# Patient Record
Sex: Female | Born: 1975 | Hispanic: No | Marital: Single | Smoking: Never smoker
Health system: Southern US, Community
[De-identification: ages and names within clinical notes are randomized; demographics above are authoritative.]

## PROBLEM LIST (undated history)

## (undated) DIAGNOSIS — F419 Anxiety disorder, unspecified: Secondary | ICD-10-CM

## (undated) DIAGNOSIS — F32A Depression, unspecified: Secondary | ICD-10-CM

## (undated) DIAGNOSIS — D649 Anemia, unspecified: Secondary | ICD-10-CM

## (undated) DIAGNOSIS — J189 Pneumonia, unspecified organism: Secondary | ICD-10-CM

## (undated) DIAGNOSIS — H8109 Meniere's disease, unspecified ear: Secondary | ICD-10-CM

## (undated) DIAGNOSIS — K219 Gastro-esophageal reflux disease without esophagitis: Secondary | ICD-10-CM

## (undated) DIAGNOSIS — F329 Major depressive disorder, single episode, unspecified: Secondary | ICD-10-CM

## (undated) DIAGNOSIS — M1990 Unspecified osteoarthritis, unspecified site: Secondary | ICD-10-CM

## (undated) HISTORY — PX: NO PAST SURGERIES: SHX2092

---

## 2002-10-28 ENCOUNTER — Other Ambulatory Visit: Admission: RE | Admit: 2002-10-28 | Discharge: 2002-10-28 | Payer: Self-pay | Admitting: Obstetrics and Gynecology

## 2003-12-15 ENCOUNTER — Other Ambulatory Visit: Admission: RE | Admit: 2003-12-15 | Discharge: 2003-12-15 | Payer: Self-pay | Admitting: Obstetrics and Gynecology

## 2004-10-11 ENCOUNTER — Other Ambulatory Visit: Admission: RE | Admit: 2004-10-11 | Discharge: 2004-10-11 | Payer: Self-pay | Admitting: Obstetrics and Gynecology

## 2005-05-10 ENCOUNTER — Encounter: Admission: RE | Admit: 2005-05-10 | Discharge: 2005-05-10 | Payer: Self-pay | Admitting: Emergency Medicine

## 2005-10-27 ENCOUNTER — Other Ambulatory Visit: Admission: RE | Admit: 2005-10-27 | Discharge: 2005-10-27 | Payer: Self-pay | Admitting: Obstetrics and Gynecology

## 2006-03-14 ENCOUNTER — Other Ambulatory Visit: Admission: RE | Admit: 2006-03-14 | Discharge: 2006-03-14 | Payer: Self-pay | Admitting: Obstetrics and Gynecology

## 2006-07-13 ENCOUNTER — Other Ambulatory Visit: Admission: RE | Admit: 2006-07-13 | Discharge: 2006-07-13 | Payer: Self-pay | Admitting: Obstetrics and Gynecology

## 2006-11-21 ENCOUNTER — Other Ambulatory Visit: Admission: RE | Admit: 2006-11-21 | Discharge: 2006-11-21 | Payer: Self-pay | Admitting: Obstetrics and Gynecology

## 2012-03-14 ENCOUNTER — Ambulatory Visit (INDEPENDENT_AMBULATORY_CARE_PROVIDER_SITE_OTHER): Payer: 59 | Admitting: Obstetrics and Gynecology

## 2012-03-14 DIAGNOSIS — Z01419 Encounter for gynecological examination (general) (routine) without abnormal findings: Secondary | ICD-10-CM

## 2012-03-14 DIAGNOSIS — Z202 Contact with and (suspected) exposure to infections with a predominantly sexual mode of transmission: Secondary | ICD-10-CM

## 2014-01-17 ENCOUNTER — Other Ambulatory Visit: Payer: Self-pay | Admitting: Family Medicine

## 2014-01-17 DIAGNOSIS — H9190 Unspecified hearing loss, unspecified ear: Secondary | ICD-10-CM

## 2014-01-27 ENCOUNTER — Ambulatory Visit
Admission: RE | Admit: 2014-01-27 | Discharge: 2014-01-27 | Disposition: A | Discharge status: Home / Self Care | Payer: 59 | Source: Ambulatory Visit | Attending: Family Medicine | Admitting: Family Medicine

## 2014-01-27 DIAGNOSIS — H9190 Unspecified hearing loss, unspecified ear: Secondary | ICD-10-CM

## 2014-01-27 MED ORDER — GADOBENATE DIMEGLUMINE 529 MG/ML IV SOLN
17.0000 mL | Freq: Once | INTRAVENOUS | Status: AC | PRN
  Administered 2014-01-27: 17 mL via INTRAVENOUS

## 2014-01-28 ENCOUNTER — Emergency Department (HOSPITAL_COMMUNITY)
Admission: EM | Admit: 2014-01-28 | Discharge: 2014-01-28 | Disposition: A | Discharge status: Home / Self Care | Payer: 59 | Attending: Emergency Medicine | Admitting: Emergency Medicine

## 2014-01-28 ENCOUNTER — Encounter (HOSPITAL_COMMUNITY): Payer: Self-pay | Admitting: Emergency Medicine

## 2014-01-28 DIAGNOSIS — Z79899 Other long term (current) drug therapy: Secondary | ICD-10-CM | POA: Insufficient documentation

## 2014-01-28 DIAGNOSIS — IMO0002 Reserved for concepts with insufficient information to code with codable children: Secondary | ICD-10-CM | POA: Insufficient documentation

## 2014-01-28 DIAGNOSIS — K115 Sialolithiasis: Secondary | ICD-10-CM | POA: Insufficient documentation

## 2014-01-28 MED ORDER — HYDROCODONE-ACETAMINOPHEN 5-325 MG PO TABS
1.0000 | ORAL_TABLET | Freq: Once | ORAL | Status: AC
  Administered 2014-01-28: 1 via ORAL
  Filled 2014-01-28: qty 1

## 2014-01-28 MED ORDER — AMOXICILLIN 500 MG PO CAPS
1000.0000 mg | ORAL_CAPSULE | Freq: Once | ORAL | Status: AC
  Administered 2014-01-28: 1000 mg via ORAL
  Filled 2014-01-28: qty 2

## 2014-01-28 MED ORDER — HYDROCODONE-ACETAMINOPHEN 5-325 MG PO TABS: 1.0000 | ORAL_TABLET | Freq: Four times a day (QID) | ORAL | Status: DC | PRN

## 2014-01-28 NOTE — ED Provider Notes (Signed)
CSN: [id number]     Arrival date & time 01/28/14  0219 History   First MD Initiated Contact with Patient 01/28/14 0301     Chief Complaint  Patient presents with  . Oral Swelling   (Consider location/radiation/quality/duration/timing/severity/associated sxs/prior Treatment) HPI This is a 38 year old female with a four-day history of pain in her right submandibular salivary gland. She has noticed a knot in her right Wharton's duct which she suspects is a stone. She has been attempting to massage the stone out and has been sucking on sour candy without relief. She was seen by her PCP yesterday and placed on naproxen and amoxicillin. She states the naproxen has not helped the pain at all and she has not yet started the amoxicillin. The pain is intermittent and is moderate to severe at its worst. It radiates to the right side of the face. She denies fever. She has been sucking on hard candies and massaging the duct without relief.  History reviewed. No pertinent past medical history. History reviewed. No pertinent past surgical history. History reviewed. No pertinent family history. History  Substance Use Topics  . Smoking status: Never Smoker   . Smokeless tobacco: Not on file  . Alcohol Use: Yes   OB History   Grav Para Term Preterm Abortions TAB SAB Ect Mult Living                 Review of Systems  All other systems reviewed and are negative.    Allergies  Review of patient's allergies indicates no known allergies.  Home Medications   Current Outpatient Rx  Name  Route  Sig  Dispense  Refill  . buPROPion (WELLBUTRIN XL) 150 MG 24 hr tablet   Oral   Take 150 mg by mouth daily.         . cetirizine (ZYRTEC ALLERGY) 10 MG tablet   Oral   Take 10 mg by mouth daily.         . drospirenone-ethinyl estradiol (YASMIN 28) 3-0.03 MG tablet   Oral   Take 1 tablet by mouth daily.         . fluticasone (FLONASE) 50 MCG/ACT nasal spray   Each Nare   Place 2 sprays into both  nostrils daily.         Marland Kitchen. ibuprofen (ADVIL,MOTRIN) 200 MG tablet   Oral   Take 400 mg by mouth every 6 (six) hours as needed (pain).         . naproxen (NAPROSYN) 500 MG tablet   Oral   Take 500 mg by mouth 2 (two) times daily as needed (pain).          BP 128/89  Pulse 79  Temp(Src) 98.1 F (36.7 C) (Oral)  Resp 19  SpO2 97%  LMP 01/27/2014  Physical Exam General: Well-developed, well-nourished female in no acute distress; appearance consistent with age of record HENT: normocephalic; atraumatic; swelling and tenderness of right submandibular salivary gland without erythema or warmth; stone visible and palpable in Wharton's duct on the right Eyes: pupils equal, round and reactive to light; extraocular muscles intact Neck: supple; no lymphadenopathy Heart: regular rate and rhythm Lungs: Normal respiratory effort and excursion Abdomen: soft; nondistended Extremities: No deformity; full range of motion Neurologic: Awake, alert and oriented; motor function intact in all extremities and symmetric; no facial droop Skin: Warm and dry Psychiatric: Normal mood and affect    ED Course  Procedures (including critical care time)    MDM  The patient  is established with Dr. Jenne Pane at [facility]. We will have her contact him in the morning for definitive treatment.    Hanley Seamen, MD 01/28/14 [phone]

## 2014-01-28 NOTE — Discharge Instructions (Signed)
Salivary Stone  Your exam shows you have a stone in one of your saliva glands. These small stones form around a mucous plug in the ducts of the glands and cause the saliva in the gland to be blocked. This makes the gland swollen and painful, especially when you eat. If repeated episodes occur, the gland can become infected. Sometimes these stones can be seen on x-ray.  Treatment includes stimulating the production of saliva to push the stone out. You should suck on a lemon or sour candies several times daily. Antibiotic medicine may be needed if the gland is infected. Increasing fluids, applying warm compresses to the swollen area 3-4 times daily, and massaging the gland from back to front may encourage drainage and passage of the stone.  Surgical treatment to remove the stone is sometimes necessary, so proper medical follow up is very important. Call your doctor for an appointment as recommended. Call right away if you have a high fever, severe headache, vomiting, uncontrolled pain, or other serious symptoms.  Document Released: 01/19/2005 Document Revised: 03/05/2012 Document Reviewed: 12/12/2005  Exit[employer] Patient Information 2014 [employer].

## 2014-01-28 NOTE — ED Notes (Signed)
Pt states that her salivary glands are blocked and she has pain to her R lower jaw/neck area. Pt has swelling to the R side of face. Pt seen at dr yesterday and was given naproxen and amoxicillin for treatment. Pt reports that pain has gotten worse. Pt denies any trouble swallowing or breathing. Pt does report pain going up to her R ear.

## 2014-01-28 NOTE — ED Notes (Signed)
Patient is alert and oriented x3.  She was given DC instructions and follow up visit instructions.  Patient gave verbal understanding. She was DC ambulatory under her own power to home.  V/S stable.  He was not showing any signs of distress on DC 

## 2015-02-12 ENCOUNTER — Ambulatory Visit (INDEPENDENT_AMBULATORY_CARE_PROVIDER_SITE_OTHER): Payer: BLUE CROSS/BLUE SHIELD | Admitting: Sports Medicine

## 2015-02-12 ENCOUNTER — Encounter: Payer: Self-pay | Admitting: Sports Medicine

## 2015-02-12 ENCOUNTER — Ambulatory Visit
Admission: RE | Admit: 2015-02-12 | Discharge: 2015-02-12 | Disposition: A | Discharge status: Home / Self Care | Payer: BLUE CROSS/BLUE SHIELD | Source: Ambulatory Visit | Attending: Sports Medicine | Admitting: Sports Medicine

## 2015-02-12 VITALS — BP 120/82 | Ht 64.0 in | Wt 186.0 lb

## 2015-02-12 DIAGNOSIS — M545 Low back pain, unspecified: Secondary | ICD-10-CM

## 2015-02-12 NOTE — Progress Notes (Signed)
  Karen Mullins - 39 y.o. female MRN [id number]  Date of birth: [birth year]  SUBJECTIVE: CC: Low Back pain, new patient HPI:   Worsening low back pain since starting exercise regimen with personal trainer. Dionne Milo(Victoria Glovitch).  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 Pain is worse with hyperextension.   generalized low back and reports "switching sides ". Occasionally radiates to mid thoracic region.    prior issues with right-sided " pinched nerves" located over the  Right flank region.   Denies radicular components but does occasionally have tightness in her thighs and calf swelling and exercising.     Worse with prolonged walking, has not noted improvement with using a shopping cart    pain is worse with lifting heavy items    working on postural control as reported to have increased lumbar lordosis   Most bothersome and reproducible symptoms current with gentle forward flexion such as when washing dishes. Other irritating factors include lifting , vacuuming, wearing high heels lifting legs to kick.   she does have 3-4 nighttime awakenings nightly due to her back pain  ROS:  Denies any fevers, chills , night sweats. Denies changes in bowel or bladder , saddle anesthesia. Approximately 15 pound intentional weight loss since starting her exercise regimen but she has had marked weight gain over the past several years.  HISTORY:  Past Medical, Surgical, Social, and Family History reviewed & updated per EMR.  Pertinent Historical Findings include:  reports that she has never smoked. She does not have any smokeless tobacco history on file.  medications reviewed  Pertinent for anxiety on bupropion and well controlled  no prior surgeries   nonsmoker , occasional alcohol use   OBJECTIVE:  VS:   HT:5\' 4"  (162.6 cm)   WT:186 lb (84.369 kg)  BMI:32          BP:120/82 mmHg  HR: bpm  TEMP: ( )  RESP:   PHYSICAL EXAM:  GENERAL:  adult obese Caucasian female. No acute distress PSYCH: Alert and appropriately interactive. VASCULAR: lower extremity pulses 2+; no significant pretibial edema NEURO: Lower extremity strength is 5+/5 in all myotomes; slight dysesthesia in L2 dermatome on the left however otherwise lower extremity sensation is intact to light touch in all dermatomes. Bilateral negative straight leg raise BACK: No midline tenderness. Ipsilateral pain with left-sided stork testing, no pain with right-sided stork testing. Tenderness to palpation over right SI joint. L4 flexed rotated right side bent left. Heel & toe walk is normal. Popliteal angle of 60 on the right, 75 on left. Positive Thomas test on right  ASSESSMENT: 1. Bilateral low back pain without sciatica    Problem  Bilateral Low Back Pain Without Sciatica   Long-standing since childhood. Concern for central pars defect.  02/12/2015: Started on core strengthening with limiting extension exercises. X-rays pending     PLAN: See problem based charting & AVS for additional documentation.  HEP: Core strengthening with abdominal crunches, bicycle crunches, progress to flanks. Avoid any extension activities  Obtain x-rays to evaluate for potential spondylolisthesis versus spondylolysis.  Consider MRI for further evaluation of soft tissue and potential bony edema  Will call with x-ray results > Return in about 6 weeks (around 03/26/2015).

## 2015-02-13 DIAGNOSIS — M545 Low back pain, unspecified: Secondary | ICD-10-CM | POA: Insufficient documentation

## 2015-02-16 ENCOUNTER — Telehealth: Payer: Self-pay | Admitting: Sports Medicine

## 2015-02-16 ENCOUNTER — Encounter: Payer: Self-pay | Admitting: *Deleted

## 2015-02-16 DIAGNOSIS — M545 Low back pain, unspecified: Secondary | ICD-10-CM

## 2015-02-16 NOTE — Telephone Encounter (Signed)
-----   Message from Earl[place]eton Moore, [place] sent at 02/16/2015  9:25 AM EST ----- Regarding: FW: xray results Contact: [phone] Will you call her back for Fields since he's away this week ----- Message -----    From: Lizbeth BarkMelanie L Ceresi    Sent: 02/16/2015   9:12 AM      To: Lillia PaulsNeeton Moore, CMA Subject: xray results                                   Pt looking for Back xray results

## 2015-02-16 NOTE — Telephone Encounter (Signed)
  I spoke with the patient on the phone today after reviewing the x-rays that Dr. Darrick PennaFields ordered of her lumbar spine. I am calling her in lieu of Dr. Darrick PennaFields' absence. There is no evidence of stress fracture or spondylolisthesis. There is some mild degenerative changes of the facet joints at L4-L5 and L5-S1. No significant disc space narrowing. In the most recent office note dictated by Dr. Berline Choughigby mention was made of possibly pursuing an MRI if the x-rays were unremarkable. At this point the patient would like to wait on that for now. She has a follow-up appointment with Dr. Darrick PennaFields at the end of March. She has been educated in some core strengthening and I've encouraged her to continue with this. It is quite possible that the degenerative changes seen in her facet joints are responsible for her low back pain. I've encouraged her to call me with questions or concerns that may arise over the next week prior to Dr. Darrick PennaFields' return.

## 2015-02-16 NOTE — Telephone Encounter (Signed)
Addendum to previous phone note: Patient is now interested in pursuing an MRI of her lumbar spine. We will go ahead and order this and follow-up with her thereafter to discuss those findings and delineate further treatment.

## 2015-02-18 ENCOUNTER — Ambulatory Visit
Admission: RE | Admit: 2015-02-18 | Discharge: 2015-02-18 | Disposition: A | Discharge status: Home / Self Care | Payer: BLUE CROSS/BLUE SHIELD | Source: Ambulatory Visit | Attending: Sports Medicine | Admitting: Sports Medicine

## 2015-02-18 DIAGNOSIS — M545 Low back pain, unspecified: Secondary | ICD-10-CM

## 2015-03-19 ENCOUNTER — Ambulatory Visit: Payer: BLUE CROSS/BLUE SHIELD | Admitting: Sports Medicine

## 2016-04-25 ENCOUNTER — Encounter (HOSPITAL_BASED_OUTPATIENT_CLINIC_OR_DEPARTMENT_OTHER): Payer: Self-pay | Admitting: *Deleted

## 2016-04-25 ENCOUNTER — Emergency Department (HOSPITAL_BASED_OUTPATIENT_CLINIC_OR_DEPARTMENT_OTHER): Payer: BLUE CROSS/BLUE SHIELD

## 2016-04-25 ENCOUNTER — Emergency Department (HOSPITAL_BASED_OUTPATIENT_CLINIC_OR_DEPARTMENT_OTHER)
Admission: EM | Admit: 2016-04-25 | Discharge: 2016-04-25 | Disposition: A | Discharge status: Home / Self Care | Payer: BLUE CROSS/BLUE SHIELD | Attending: Emergency Medicine | Admitting: Emergency Medicine

## 2016-04-25 DIAGNOSIS — R109 Unspecified abdominal pain: Secondary | ICD-10-CM

## 2016-04-25 DIAGNOSIS — K8050 Calculus of bile duct without cholangitis or cholecystitis without obstruction: Secondary | ICD-10-CM

## 2016-04-25 DIAGNOSIS — K8020 Calculus of gallbladder without cholecystitis without obstruction: Secondary | ICD-10-CM

## 2016-04-25 DIAGNOSIS — R1011 Right upper quadrant pain: Secondary | ICD-10-CM | POA: Diagnosis present

## 2016-04-25 DIAGNOSIS — K8070 Calculus of gallbladder and bile duct without cholecystitis without obstruction: Secondary | ICD-10-CM | POA: Insufficient documentation

## 2016-04-25 LAB — CBC WITH DIFFERENTIAL/PLATELET
BASOS ABS: 0 10*3/uL (ref 0.0–0.1)
BASOS PCT: 0 %
EOS PCT: 0 %
Eosinophils Absolute: 0 10*3/uL (ref 0.0–0.7)
HCT: 40.7 % (ref 36.0–46.0)
Hemoglobin: 13.7 g/dL (ref 12.0–15.0)
Lymphocytes Relative: 5 %
Lymphs Abs: 0.7 10*3/uL (ref 0.7–4.0)
MCH: 31.2 pg (ref 26.0–34.0)
MCHC: 33.7 g/dL (ref 30.0–36.0)
MCV: 92.7 fL (ref 78.0–100.0)
MONO ABS: 0.4 10*3/uL (ref 0.1–1.0)
Monocytes Relative: 3 %
NEUTROS ABS: 12.5 10*3/uL — AB (ref 1.7–7.7)
Neutrophils Relative %: 92 %
PLATELETS: 282 10*3/uL (ref 150–400)
RBC: 4.39 MIL/uL (ref 3.87–5.11)
RDW: 12.8 % (ref 11.5–15.5)
WBC: 13.7 10*3/uL — ABNORMAL HIGH (ref 4.0–10.5)

## 2016-04-25 LAB — COMPREHENSIVE METABOLIC PANEL
ALBUMIN: 3.6 g/dL (ref 3.5–5.0)
ALT: 14 U/L (ref 14–54)
AST: 26 U/L (ref 15–41)
Alkaline Phosphatase: 66 U/L (ref 38–126)
Anion gap: 10 (ref 5–15)
BUN: 11 mg/dL (ref 6–20)
CHLORIDE: 106 mmol/L (ref 101–111)
CO2: 24 mmol/L (ref 22–32)
Calcium: 9.1 mg/dL (ref 8.9–10.3)
Creatinine, Ser: 0.75 mg/dL (ref 0.44–1.00)
GFR calc Af Amer: >60 mL/min (ref >60)
GFR calc non Af Amer: >60 mL/min (ref >60)
GLUCOSE: 111 mg/dL — AB (ref 65–99)
POTASSIUM: 3.7 mmol/L (ref 3.5–5.1)
SODIUM: 140 mmol/L (ref 135–145)
Total Bilirubin: 0.8 mg/dL (ref 0.3–1.2)
Total Protein: 6.9 g/dL (ref 6.5–8.1)

## 2016-04-25 LAB — URINALYSIS, ROUTINE W REFLEX MICROSCOPIC
Glucose, UA: NEGATIVE mg/dL
Ketones, ur: 15 mg/dL — AB
Leukocytes, UA: NEGATIVE
Nitrite: NEGATIVE
Protein, ur: 100 mg/dL — AB
SPECIFIC GRAVITY, URINE: 1.039 — AB (ref 1.005–1.030)
pH: 6 (ref 5.0–8.0)

## 2016-04-25 LAB — URINE MICROSCOPIC-ADD ON

## 2016-04-25 LAB — PREGNANCY, URINE: Preg Test, Ur: NEGATIVE

## 2016-04-25 LAB — LIPASE, BLOOD: Lipase: 19 U/L (ref 11–51)

## 2016-04-25 MED ORDER — HYDROCODONE-ACETAMINOPHEN 5-325 MG PO TABS: 1.0000 | ORAL_TABLET | Freq: Four times a day (QID) | ORAL | Status: AC | PRN

## 2016-04-25 MED ORDER — ONDANSETRON HCL 4 MG/2ML IJ SOLN
4.0000 mg | Freq: Once | INTRAMUSCULAR | Status: AC
  Administered 2016-04-25: 4 mg via INTRAVENOUS
  Filled 2016-04-25: qty 2

## 2016-04-25 MED ORDER — ONDANSETRON 4 MG PO TBDP: 4.0000 mg | ORAL_TABLET | Freq: Three times a day (TID) | ORAL | Status: AC | PRN

## 2016-04-25 MED ORDER — FENTANYL CITRATE (PF) 100 MCG/2ML IJ SOLN
50.0000 ug | Freq: Once | INTRAMUSCULAR | Status: AC
  Administered 2016-04-25: 50 ug via INTRAVENOUS
  Filled 2016-04-25: qty 2

## 2016-04-25 NOTE — ED Notes (Signed)
Pt c/o right upper and lower abd pain x 12 hrs , sent here from [facility] Toradol IM given with relief

## 2016-04-25 NOTE — ED Notes (Signed)
Patient transported to Ultrasound 

## 2016-04-25 NOTE — ED Provider Notes (Signed)
CSN: [id number]     Arrival date & time 04/25/16  1724 History  By signing my name below, I, Linna Darner, attest that this documentation has been prepared under the direction and in the presence of physician practitioner, Vanetta Mulders, MD. Electronically Signed: Linna Darner, Scribe. 04/25/2016. 6:25 PM.    Chief Complaint  Patient presents with  . Abdominal Pain    Patient is a 40 y.o. female presenting with abdominal pain. The history is provided by the patient. No language interpreter was used.  Abdominal Pain Pain location:  RUQ and RLQ Pain quality: dull and sharp   Pain radiates to:  Does not radiate Pain severity:  Moderate Onset quality:  Sudden Duration:  13 hours Timing:  Constant Progression:  Improving Chronicity:  New Associated symptoms: chills, fever and nausea   Associated symptoms: no chest pain, no cough, no diarrhea, no dysuria, no shortness of breath, no sore throat and no vomiting   Fever:    Duration:  13 hours   Timing:  Unable to specify   Temp source:  Subjective   Progression:  Improving Nausea:    Severity:  Moderate   Onset quality:  Sudden   Duration:  13 hours   Timing:  Unable to specify   Progression:  Unable to specify   HPI Comments: Karen Mullins is a 40 y.o. female with no significant PMHx who presents to the Emergency Department complaining of sudden onset, constant, sharp, dull, improving, RUQ and RLQ pain for the last 13 hours. Pt went to Urgent Care for her pain and was referred to the ER; she received Toradol while at Urgent Care. She states that her abdominal pain is 2/10 currently and was 8/10 at its worst; her most significant pain is in the RUQ. She endorses associated mild nausea, fever, and chills as well. She has not experienced similar symptoms in the past. She also notes intermittent pain between her bilateral shoulder blades for the last three weeks. She denies vomiting or any other associated symptoms.  History reviewed. No  pertinent past medical history. History reviewed. No pertinent past surgical history. History reviewed. No pertinent family history. Social History  Substance Use Topics  . Smoking status: Never Smoker   . Smokeless tobacco: None  . Alcohol Use: Yes   OB History    No data available     Review of Systems  Constitutional: Positive for fever and chills.  HENT: Negative for rhinorrhea and sore throat.   Eyes: Negative for visual disturbance.  Respiratory: Negative for cough and shortness of breath.   Cardiovascular: Negative for chest pain.  Gastrointestinal: Positive for nausea and abdominal pain. Negative for vomiting and diarrhea.  Genitourinary: Negative for dysuria.  Musculoskeletal: Positive for back pain. Negative for joint swelling and neck pain.  Skin: Negative for rash.  Neurological: Negative for headaches.  Hematological: Does not bruise/bleed easily.  Psychiatric/Behavioral: Negative for confusion.    Allergies  Review of patient's allergies indicates no known allergies.  Home Medications   Prior to Admission medications   Medication Sig Start Date End Date Taking? Authorizing Provider  cetirizine (ZYRTEC ALLERGY) 10 MG tablet Take 10 mg by mouth daily.    Historical Provider, MD  drospirenone-ethinyl estradiol (YASMIN 28) 3-0.03 MG tablet Take 1 tablet by mouth daily.    Historical Provider, MD  fluticasone (FLONASE) 50 MCG/ACT nasal spray Place 2 sprays into both nostrils daily.    Historical Provider, MD  HYDROcodone-acetaminophen (NORCO/VICODIN) 5-325 MG tablet Take 1-2 tablets  by mouth every 6 (six) hours as needed for moderate pain. 04/25/16   Vanetta MuldersScott Roberta Kelly, MD  ibuprofen (ADVIL,MOTRIN) 200 MG tablet Take 400 mg by mouth every 6 (six) hours as needed (pain).    Historical Provider, MD  naproxen (NAPROSYN) 500 MG tablet Take 500 mg by mouth 2 (two) times daily as needed (pain).    Historical Provider, MD  ondansetron (ZOFRAN ODT) 4 MG disintegrating tablet Take  1 tablet (4 mg total) by mouth every 8 (eight) hours as needed for nausea or vomiting. 04/25/16   Vanetta MuldersScott Jaedin Regina, MD   BP 122/76 mmHg  Pulse 71  Temp(Src) 98.4 F (36.9 C) (Oral)  Resp 18  SpO2 99%  LMP 04/18/2016 Physical Exam  Constitutional: She is oriented to person, place, and time. She appears well-developed and well-nourished. No distress.  HENT:  Head: Normocephalic and atraumatic.  Mouth/Throat: Mucous membranes are normal.  Eyes: Conjunctivae and EOM are normal. Pupils are equal, round, and reactive to light.  Sclera are clear bilaterally.  Neck: Neck supple. No tracheal deviation present.  Cardiovascular: Normal rate, regular rhythm and normal heart sounds.   Pulmonary/Chest: Effort normal and breath sounds normal. No respiratory distress.  Abdominal: Soft. Bowel sounds are normal. There is tenderness (RUQ) in the right upper quadrant. There is no guarding.  Tender in the RUQ, no guarding.  Musculoskeletal: Normal range of motion.       Right ankle: She exhibits no swelling.       Left ankle: She exhibits no swelling.  Neurological: She is alert and oriented to person, place, and time. She displays normal reflexes. She exhibits normal muscle tone. Coordination normal.  Skin: Skin is warm and dry.  Psychiatric: She has a normal mood and affect. Her behavior is normal.  Nursing note and vitals reviewed.   ED Course  Procedures (including critical care time)  DIAGNOSTIC STUDIES: Oxygen Saturation is 99% on RA, normal by my interpretation.    COORDINATION OF CARE: 6:25 PM Discussed treatment plan with pt at bedside and pt agreed to plan.  Medications  fentaNYL (SUBLIMAZE) injection 50 mcg (not administered)  ondansetron (ZOFRAN) injection 4 mg (not administered)  ondansetron (ZOFRAN) injection 4 mg (4 mg Intravenous Given 04/25/16 1843)    Results for orders placed or performed during the hospital encounter of 04/25/16  Pregnancy, urine  Result Value Ref Range    Preg Test, Ur NEGATIVE NEGATIVE  Urinalysis, Routine w reflex microscopic ([facility])  Result Value Ref Range   Color, Urine AMBER (A) YELLOW   APPearance TURBID (A) CLEAR   Specific Gravity, Urine 1.039 (H) 1.005 - 1.030   pH 6.0 5.0 - 8.0   Glucose, UA NEGATIVE NEGATIVE mg/dL   Hgb urine dipstick MODERATE (A) NEGATIVE   Bilirubin Urine SMALL (A) NEGATIVE   Ketones, ur 15 (A) NEGATIVE mg/dL   Protein, ur 161100 (A) NEGATIVE mg/dL   Nitrite NEGATIVE NEGATIVE   Leukocytes, UA NEGATIVE NEGATIVE  Urine microscopic-add on  Result Value Ref Range   Squamous Epithelial / LPF 0-5 (A) NONE SEEN   WBC, UA 0-5 0 - 5 WBC/hpf   RBC / HPF 0-5 0 - 5 RBC/hpf   Bacteria, UA FEW (A) NONE SEEN   Urine-Other LESS THAN 10 mL OF URINE SUBMITTED   CBC with Differential  Result Value Ref Range   WBC 13.7 (H) 4.0 - 10.5 K/uL   RBC 4.39 3.87 - 5.11 MIL/uL   Hemoglobin 13.7 12.0 - 15.0 g/dL  HCT 40.7 36.0 - 46.0 %   MCV 92.7 78.0 - 100.0 fL   MCH 31.2 26.0 - 34.0 pg   MCHC 33.7 30.0 - 36.0 g/dL   RDW 40.9 81.1 - 91.4 %   Platelets 282 150 - 400 K/uL   Neutrophils Relative % 92 %   Neutro Abs 12.5 (H) 1.7 - 7.7 K/uL   Lymphocytes Relative 5 %   Lymphs Abs 0.7 0.7 - 4.0 K/uL   Monocytes Relative 3 %   Monocytes Absolute 0.4 0.1 - 1.0 K/uL   Eosinophils Relative 0 %   Eosinophils Absolute 0.0 0.0 - 0.7 K/uL   Basophils Relative 0 %   Basophils Absolute 0.0 0.0 - 0.1 K/uL  Comprehensive metabolic panel  Result Value Ref Range   Sodium 140 135 - 145 mmol/L   Potassium 3.7 3.5 - 5.1 mmol/L   Chloride 106 101 - 111 mmol/L   CO2 24 22 - 32 mmol/L   Glucose, Bld 111 (H) 65 - 99 mg/dL   BUN 11 6 - 20 mg/dL   Creatinine, Ser 7.82 0.44 - 1.00 mg/dL   Calcium 9.1 8.9 - 95.6 mg/dL   Total Protein 6.9 6.5 - 8.1 g/dL   Albumin 3.6 3.5 - 5.0 g/dL   AST 26 15 - 41 U/L   ALT 14 14 - 54 U/L   Alkaline Phosphatase 66 38 - 126 U/L   Total Bilirubin 0.8 0.3 - 1.2 mg/dL   GFR calc non Af Amer >60 >60 mL/min    GFR calc Af Amer >60 >60 mL/min   Anion gap 10 5 - 15  Lipase, blood  Result Value Ref Range   Lipase 19 11 - 51 U/L   US Abdomen Complete  04/25/2016  CLINICAL DATA:  Right upper quadrant abdominal pain today. History of fever and elevated white blood cell count. EXAM: ABDOMEN ULTRASOUND COMPLETE COMPARISON:  None. FINDINGS: Gallbladder: There is an approximately 0.5 x 0.7 cm echogenic non shadowing adherent stone versus polyp within otherwise normal-appearing gallbladder. No gallbladder wall thickening or pericholecystic fluid. Negative sonographic Murphy's sign. Common bile duct: Diameter: Normal in size measuring 2.5 mm in diameter Liver: There is relative homogeneity of the hepatic parenchyma. No discrete hepatic lesions. No definite evidence of intrahepatic biliary ductal dilatation. No ascites. IVC: No abnormality visualized. Pancreas: Limited visualization of the pancreatic head and neck is normal. Visualization of the pancreatic body and tail is obscured by bowel gas. Spleen: Normal in size measuring 10.5 cm in length Right Kidney: Normal cortical thickness, echogenicity and size, measuring 10.1 cm in length. No focal renal lesions. No echogenic renal stones. No urinary obstruction. Left Kidney: Normal cortical thickness, echogenicity and size, measuring 11.7 cm in length. No focal renal lesions. No echogenic renal stones. No urinary obstruction. Abdominal aorta: No aneurysm visualized. Other findings: None. IMPRESSION: Approximately 0.7 cm adherent stone versus polyp within otherwise normal-appearing gallbladder. If clinical concern persists for acute cholecystitis, further evaluation could be performed with nuclear medicine HIDA scan as indicated. As this structure may indeed represent a gallbladder polyp, if interval cholecystectomy is not pursued, a follow-up ultrasound is recommended in 1 year to ensure stability. Electronically Signed   By: Simonne Come M.D.   On: 04/25/2016 19:48      EKG  Interpretation None      MDM   Final diagnoses:  Abdominal pain  Biliary colic  Calculus of gallbladder without cholecystitis without obstruction     Abdominal pain right upper quadrant has  resolved and nontender on exam now. Ultrasound with probable large gallstone in the gallbladder. Common bile duct normal. Bilirubin not elevated. Lipase not elevated. Will refer patient to general surgery for further evaluation with precautions to return for recurrent pain. Patient does have a mild leukocytosis but otherwise labs are normal.    I personally performed the services described in this documentation, which was scribed in my presence. The recorded information has been reviewed and is accurate.     Vanetta Mulders, MD 04/25/16 2055

## 2016-04-25 NOTE — Discharge Instructions (Signed)
Biliary Colic Biliary colic is a pain in the upper abdomen. The pain:  Is usually felt on the right side of the abdomen, but it may also be felt in the center of the abdomen, just below the breastbone (sternum).  May spread back toward the right shoulder blade.  May be steady or irregular.  May be accompanied by nausea and vomiting. Most of the time, the pain goes away in 1-5 hours. After the most intense pain passes, the abdomen may continue to ache mildly for about 24 hours. Biliary colic is caused by a blockage in the bile duct. The bile duct is a pathway that carries bile--a liquid that helps to digest fats--from the gallbladder to the small intestine. Biliary colic usually occurs after eating, when the digestive system demands bile. The pain develops when muscle cells contract forcefully to try to move the blockage so that bile can get by. HOME CARE INSTRUCTIONS  Take medicines only as directed by your health care provider.  Drink enough fluid to keep your urine clear or pale yellow.  Avoid fatty, greasy, and fried foods. These kinds of foods increase your body's demand for bile.  Avoid any foods that make your pain worse.  Avoid overeating.  Avoid having a large meal after fasting. SEEK MEDICAL CARE IF:  You develop a fever.  Your pain gets worse.  You vomit.  You develop nausea that prevents you from eating and drinking. SEEK IMMEDIATE MEDICAL CARE IF:  You suddenly develop a fever and shaking chills.  You develop a yellowish discoloration (jaundice) of:  Skin.  Whites of the eyes.  Mucous membranes.  You have continuous or severe pain that is not relieved with medicines.  You have nausea and vomiting that is not relieved with medicines.  You develop dizziness or you faint.   This information is not intended to replace advice given to you by your health care provider. Make sure you discuss any questions you have with your health care provider.  Take  medication as needed for pain and for nausea. Make an appointment to follow-up with general surgery. Return for pain persisting for 4 hours or longer or fever or persistent vomiting.   Document Released: 05/15/2006 Document Revised: 04/28/2015 Document Reviewed: 09/23/2014 [employer] Interactive Patient Education [employer]2016 [employer]

## 2016-04-28 ENCOUNTER — Ambulatory Visit: Payer: Self-pay | Admitting: General Surgery

## 2016-04-28 NOTE — H&P (Signed)
History of Present Illness (Nandi Tonnesen MD; 04/28/2016 9:16 AM) The patient is a 39 year old female who presents for evaluation of gall stones. The patient is a 39-year-old female who is referred by most common ER for evaluation of symptomatic stones. Patient had a long history of abdominal pain, nausea vomiting, and diarrhea. Patient states that penicillin to the ER began at 5 AM. She states that she did have some pizza over the weekend. Patient also states that she has some associated diarrhea since that time. She's been nauseated, no vomiting. Patient's LFTs were within normal limits  Patient underwent ultrasound. Results below US results: Approximately 0.7 cm adherent stone versus polyp within otherwise normal-appearing gallbladder. If clinical concern persists for acute cholecystitis, further evaluation could be performed with nuclear medicine HIDA scan as indicated. As this structure may indeed represent a gallbladder polyp, if interval cholecystectomy is not pursued, a follow-up ultrasound is recommended in 1 year to ensure stability.   Other Problems ([name], CMA; 04/28/2016 8:55 AM) Anxiety Disorder Back Pain Depression Gastroesophageal Reflux Disease Other disease, cancer, significant illness  Past Surgical History ([name], CMA; 04/28/2016 8:55 AM) No pertinent past surgical history  Diagnostic Studies History ([name], CMA; 04/28/2016 8:55 AM) Colonoscopy never Mammogram never Pap Smear 1-5 years ago  Allergies ([name], CMA; 04/28/2016 8:56 AM) No Known Drug Allergies05/03/2016  Medication History ([name], CMA; 04/28/2016 8:57 AM) Phentermine HCl (37.5MG Tablet, Oral) Active. Drospirenone-Ethinyl Estradiol (3-0.03MG Tablet, Oral) Active. BuPROPion HCl ER (XL) (150MG Tablet ER 24HR, Oral) Active. Hydrocodone-Acetaminophen (5-325MG Tablet, Oral as needed) Active. Ondansetron (4MG Tablet Disperse, Oral as needed) Active. Advil  (200MG Tablet, Oral as needed) Active. Naproxen (500MG Tablet, Oral as needed) Active. ZyrTEC Allergy (10MG Capsule, Oral) Active. Flonase (50MCG/ACT Suspension, Nasal as needed) Active. Medications Reconciled  Social History ([name], CMA; 04/28/2016 8:55 AM) Alcohol use Occasional alcohol use. Caffeine use Carbonated beverages, Tea. Illicit drug use Remotely quit drug use. Tobacco use Never smoker.  Family History ([name], CMA; 04/28/2016 8:55 AM) Cancer Mother. Colon Polyps Father. Depression Father. Diabetes Mellitus Father. Hypertension Father. Migraine Headache Mother.  Pregnancy / Birth History ([name], CMA; 04/28/2016 8:55 AM) Age at menarche 13 years. Contraceptive History Oral contraceptives. Gravida 0 Para 0 Regular periods    Review of Systems ([name] CMA; 04/28/2016 8:55 AM) General Present- Appetite Loss, Chills and Fatigue. Not Present- Fever, Night Sweats, Weight Gain and Weight Loss. Skin Not Present- Change in Wart/Mole, Dryness, Hives, Jaundice, New Lesions, Non-Healing Wounds, Rash and Ulcer. HEENT Not Present- Earache, Hearing Loss, Hoarseness, Nose Bleed, Oral Ulcers, Ringing in the Ears, Seasonal Allergies, Sinus Pain, Sore Throat, Visual Disturbances, Wears glasses/contact lenses and Yellow Eyes. Respiratory Not Present- Bloody sputum, Chronic Cough, Difficulty Breathing, Snoring and Wheezing. Breast Not Present- Breast Mass, Breast Pain, Nipple Discharge and Skin Changes. Cardiovascular Not Present- Chest Pain, Difficulty Breathing Lying Down, Leg Cramps, Palpitations, Rapid Heart Rate, Shortness of Breath and Swelling of Extremities. Gastrointestinal Present- Abdominal Pain, Bloating, Change in Bowel Habits, Gets full quickly at meals, Indigestion and Nausea. Not Present- Bloody Stool, Chronic diarrhea, Constipation, Difficulty Swallowing, Excessive gas, Hemorrhoids, Rectal Pain and Vomiting. Female Genitourinary Not  Present- Frequency, Nocturia, Painful Urination, Pelvic Pain and Urgency. Musculoskeletal Present- Back Pain. Not Present- Joint Pain, Joint Stiffness, Muscle Pain, Muscle Weakness and Swelling of Extremities. Neurological Not Present- Decreased Memory, Fainting, Headaches, Numbness, Seizures, Tingling, Tremor, Trouble walking and Weakness. Psychiatric Not Present- Anxiety, Bipolar, Change in Sleep Pattern, Depression, Fearful and   Frequent crying. Endocrine Not Present- Cold Intolerance, Excessive Hunger, Hair Changes, Heat Intolerance, Hot flashes and New Diabetes. Hematology Not Present- Easy Bruising, Excessive bleeding, Gland problems, HIV and Persistent Infections.  Vitals ([name] CMA; 04/28/2016 8:55 AM) 04/28/2016 8:55 AM Weight: 179 lb Height: 64in Body Surface Area: 1.87 m Body Mass Index: 30.72 kg/m  Temp.: 97.74F(Temporal)  Pulse: 81 (Regular)  BP: 134/74 (Sitting, Left Arm, Standard)       Physical Exam Axel Filler(Neena Beecham, MD; 04/28/2016 9:17 AM) General Mental Status-Alert. General Appearance-Consistent with stated age. Hydration-Well hydrated. Voice-Normal.  Head and Neck Head-normocephalic, atraumatic with no lesions or palpable masses.  Eye Eyeball - Bilateral-Extraocular movements intact. Sclera/Conjunctiva - Bilateral-No scleral icterus.  Chest and Lung Exam Chest and lung exam reveals -quiet, even and easy respiratory effort with no use of accessory muscles. Inspection Chest Wall - Normal. Back - normal.  Cardiovascular Cardiovascular examination reveals -normal heart sounds, regular rate and rhythm with no murmurs.  Abdomen Inspection Normal Exam - No Hernias. Palpation/Percussion Normal exam - Soft, Non Tender, No Rebound tenderness, No Rigidity (guarding) and No hepatosplenomegaly. Auscultation Normal exam - Bowel sounds normal.  Neurologic Neurologic evaluation reveals -alert and oriented x 3 with no impairment  of recent or remote memory. Mental Status-Normal.  Musculoskeletal Normal Exam - Left-Upper Extremity Strength Normal and Lower Extremity Strength Normal. Normal Exam - Right-Upper Extremity Strength Normal, Lower Extremity Weakness.    Assessment & Plan Axel Filler(Dax Murguia MD; 04/28/2016 9:17 AM) SYMPTOMATIC CHOLELITHIASIS (K80.20) Impression: 40 year old female with symptomatic cholelithiasis  1. We will proceed to the operating room for a laparoscopic cholecystectomy 2. Risks and benefits were discussed with the patient to generally include, but not limited to: infection, bleeding, possible need for post op ERCP, damage to the bile ducts, bile leak, and possible need for further surgery. Alternatives were offered and described. All questions were answered and the patient voiced understanding of the procedure and wishes to proceed at this point with a laparoscopic cholecystectomy

## 2016-05-04 ENCOUNTER — Encounter (HOSPITAL_COMMUNITY): Payer: Self-pay

## 2016-05-04 ENCOUNTER — Encounter (HOSPITAL_COMMUNITY)
Admission: RE | Admit: 2016-05-04 | Discharge: 2016-05-04 | Disposition: A | Discharge status: Home / Self Care | Payer: BLUE CROSS/BLUE SHIELD | Source: Ambulatory Visit | Attending: General Surgery | Admitting: General Surgery

## 2016-05-04 ENCOUNTER — Other Ambulatory Visit (HOSPITAL_COMMUNITY): Payer: Self-pay | Admitting: *Deleted

## 2016-05-04 DIAGNOSIS — K8010 Calculus of gallbladder with chronic cholecystitis without obstruction: Secondary | ICD-10-CM | POA: Diagnosis present

## 2016-05-04 DIAGNOSIS — Z791 Long term (current) use of non-steroidal anti-inflammatories (NSAID): Secondary | ICD-10-CM | POA: Diagnosis not present

## 2016-05-04 DIAGNOSIS — Z793 Long term (current) use of hormonal contraceptives: Secondary | ICD-10-CM | POA: Diagnosis not present

## 2016-05-04 DIAGNOSIS — F329 Major depressive disorder, single episode, unspecified: Secondary | ICD-10-CM | POA: Diagnosis not present

## 2016-05-04 DIAGNOSIS — Z79891 Long term (current) use of opiate analgesic: Secondary | ICD-10-CM | POA: Diagnosis not present

## 2016-05-04 DIAGNOSIS — K811 Chronic cholecystitis: Secondary | ICD-10-CM | POA: Diagnosis not present

## 2016-05-04 DIAGNOSIS — Z7951 Long term (current) use of inhaled steroids: Secondary | ICD-10-CM | POA: Diagnosis not present

## 2016-05-04 DIAGNOSIS — Z79899 Other long term (current) drug therapy: Secondary | ICD-10-CM | POA: Diagnosis not present

## 2016-05-04 HISTORY — DX: Major depressive disorder, single episode, unspecified: F32.9

## 2016-05-04 HISTORY — DX: Depression, unspecified: F32.A

## 2016-05-04 HISTORY — DX: Anxiety disorder, unspecified: F41.9

## 2016-05-04 HISTORY — DX: Anemia, unspecified: D64.9

## 2016-05-04 HISTORY — DX: Meniere's disease, unspecified ear: H81.09

## 2016-05-04 HISTORY — DX: Pneumonia, unspecified organism: J18.9

## 2016-05-04 HISTORY — DX: Unspecified osteoarthritis, unspecified site: M19.90

## 2016-05-04 HISTORY — DX: Gastro-esophageal reflux disease without esophagitis: K21.9

## 2016-05-04 NOTE — Pre-Procedure Instructions (Signed)
Karen Mullins  05/04/2016     Your procedure is scheduled on Thursday, May 05, 2016 at 7:30 AM.   [facility] Office at 5:30 AM.   Call this number if you have problems the morning of surgery: [phone]    Remember:  Do not eat food or drink liquids after midnight tonight.  Take these medicines the morning of surgery with A SIP OF WATER: Cetirzine (Zyrtec), Yasmin, Flonase, Hydrocodone - if needed   Do not wear jewelry, make-up or nail polish.  Do not wear lotions, powders, or perfumes.  You may wear deodorant.  Do not shave 48 hours prior to surgery.    Do not bring valuables to the hospital.  [facility] is not responsible for any belongings or valuables.  Contacts, dentures or bridgework may not be worn into surgery.  Leave your suitcase in the car.  After surgery it may be brought to your room.  For patients admitted to the hospital, discharge time will be determined by your treatment team.  Patients discharged the day of surgery will not be allowed to drive home.   Special instructions:  [place] - Preparing for Surgery  Before surgery, you can play an important role.  Because skin is not sterile, your skin needs to be as free of germs as possible.  You can reduce the number of germs on you skin by washing with CHG (chlorahexidine gluconate) soap before surgery.  CHG is an antiseptic cleaner which kills germs and bonds with the skin to continue killing germs even after washing.  Please DO NOT use if you have an allergy to CHG or antibacterial soaps.  If your skin becomes reddened/irritated stop using the CHG and inform your nurse when you arrive at Short Stay.  Do not shave (including legs and underarms) for at least 48 hours prior to the first CHG shower.  You may shave your face.  Please follow these instructions carefully:   1.  Shower with CHG Soap the night before surgery and the                                morning of  Surgery.  2.  If you choose to wash your hair, wash your hair first as usual with your       normal shampoo.  3.  After you shampoo, rinse your hair and body thoroughly to remove the                      Shampoo.  4.  Use CHG as you would any other liquid soap.  You can apply chg directly       to the skin and wash gently with scrungie or a clean washcloth.  5.  Apply the CHG Soap to your body ONLY FROM THE NECK DOWN.        Do not use on open wounds or open sores.  Avoid contact with your eyes, ears, mouth and genitals (private parts).  Wash genitals (private parts) with your normal soap.  6.  Wash thoroughly, paying special attention to the area where your surgery        will be performed.  7.  Thoroughly rinse your body with warm water from the neck down.  8.  DO NOT shower/wash with your normal soap after using and rinsing off  the CHG Soap.  9.  Pat yourself dry with a clean towel.            10.  Wear clean pajamas.            11.  Place clean sheets on your bed the night of your first shower and do not        sleep with pets.  Day of Surgery  Do not apply any lotions the morning of surgery.  Please wear clean clothes to the hospital.   Please read over the following fact sheets that you were given. Pain Booklet, Coughing and Deep Breathing and Surgical Site Infection Prevention

## 2016-05-04 NOTE — Progress Notes (Signed)
Pt denies cardiac history, chest pain or sob. Pt has never had any surgeries prior to this.

## 2016-05-05 ENCOUNTER — Encounter (HOSPITAL_COMMUNITY)
Admission: RE | Disposition: A | Discharge status: Home / Self Care | Payer: Self-pay | Source: Ambulatory Visit | Attending: General Surgery

## 2016-05-05 ENCOUNTER — Ambulatory Visit (HOSPITAL_COMMUNITY): Payer: BLUE CROSS/BLUE SHIELD | Admitting: Anesthesiology

## 2016-05-05 ENCOUNTER — Encounter (HOSPITAL_COMMUNITY): Payer: Self-pay | Admitting: *Deleted

## 2016-05-05 ENCOUNTER — Ambulatory Visit (HOSPITAL_COMMUNITY)
Admission: RE | Admit: 2016-05-05 | Discharge: 2016-05-05 | Disposition: A | Discharge status: Home / Self Care | Payer: BLUE CROSS/BLUE SHIELD | Source: Ambulatory Visit | Attending: General Surgery | Admitting: General Surgery

## 2016-05-05 DIAGNOSIS — Z791 Long term (current) use of non-steroidal anti-inflammatories (NSAID): Secondary | ICD-10-CM | POA: Insufficient documentation

## 2016-05-05 DIAGNOSIS — Z793 Long term (current) use of hormonal contraceptives: Secondary | ICD-10-CM | POA: Insufficient documentation

## 2016-05-05 DIAGNOSIS — Z79899 Other long term (current) drug therapy: Secondary | ICD-10-CM | POA: Insufficient documentation

## 2016-05-05 DIAGNOSIS — Z7951 Long term (current) use of inhaled steroids: Secondary | ICD-10-CM | POA: Insufficient documentation

## 2016-05-05 DIAGNOSIS — K811 Chronic cholecystitis: Secondary | ICD-10-CM | POA: Insufficient documentation

## 2016-05-05 DIAGNOSIS — F329 Major depressive disorder, single episode, unspecified: Secondary | ICD-10-CM | POA: Insufficient documentation

## 2016-05-05 DIAGNOSIS — Z79891 Long term (current) use of opiate analgesic: Secondary | ICD-10-CM | POA: Insufficient documentation

## 2016-05-05 HISTORY — PX: CHOLECYSTECTOMY: SHX55

## 2016-05-05 SURGERY — LAPAROSCOPIC CHOLECYSTECTOMY
Anesthesia: General | Site: Abdomen

## 2016-05-05 MED ORDER — BUPIVACAINE HCL 0.25 % IJ SOLN
INTRAMUSCULAR | Status: DC | PRN
  Administered 2016-05-05: 6 mL

## 2016-05-05 MED ORDER — FENTANYL CITRATE (PF) 250 MCG/5ML IJ SOLN
INTRAMUSCULAR | Status: AC
  Filled 2016-05-05: qty 5

## 2016-05-05 MED ORDER — MEPERIDINE HCL 25 MG/ML IJ SOLN: 6.2500 mg | INTRAMUSCULAR | Status: DC | PRN

## 2016-05-05 MED ORDER — HYDROMORPHONE HCL 1 MG/ML IJ SOLN
INTRAMUSCULAR | Status: DC | PRN
  Administered 2016-05-05: 1 mg via INTRAVENOUS

## 2016-05-05 MED ORDER — SCOPOLAMINE 1 MG/3DAYS TD PT72
1.0000 | MEDICATED_PATCH | TRANSDERMAL | Status: AC
  Administered 2016-05-05: 1 via TRANSDERMAL
  Filled 2016-05-05: qty 1

## 2016-05-05 MED ORDER — SUGAMMADEX SODIUM 200 MG/2ML IV SOLN
INTRAVENOUS | Status: DC | PRN
  Administered 2016-05-05: 200 mg via INTRAVENOUS

## 2016-05-05 MED ORDER — SODIUM CHLORIDE 0.9% FLUSH: 3.0000 mL | INTRAVENOUS | Status: DC | PRN

## 2016-05-05 MED ORDER — PROPOFOL 10 MG/ML IV BOLUS
INTRAVENOUS | Status: DC | PRN
  Administered 2016-05-05: 200 mg via INTRAVENOUS

## 2016-05-05 MED ORDER — PROPOFOL 10 MG/ML IV BOLUS
INTRAVENOUS | Status: AC
  Filled 2016-05-05: qty 20

## 2016-05-05 MED ORDER — METOCLOPRAMIDE HCL 5 MG/ML IJ SOLN
10.0000 mg | Freq: Once | INTRAMUSCULAR | Status: AC | PRN
  Administered 2016-05-05: 10 mg via INTRAVENOUS

## 2016-05-05 MED ORDER — CHLORHEXIDINE GLUCONATE 4 % EX LIQD: 1.0000 "application " | Freq: Once | CUTANEOUS | Status: DC

## 2016-05-05 MED ORDER — SODIUM CHLORIDE 0.9 % IR SOLN
Status: DC | PRN
  Administered 2016-05-05: 1000 mL

## 2016-05-05 MED ORDER — FENTANYL CITRATE (PF) 100 MCG/2ML IJ SOLN
25.0000 ug | INTRAMUSCULAR | Status: DC | PRN
  Administered 2016-05-05 (×2): 50 ug via INTRAVENOUS

## 2016-05-05 MED ORDER — MORPHINE SULFATE (PF) 2 MG/ML IV SOLN
2.0000 mg | INTRAVENOUS | Status: DC | PRN
  Administered 2016-05-05: 2 mg via INTRAVENOUS

## 2016-05-05 MED ORDER — LACTATED RINGERS IV SOLN
INTRAVENOUS | Status: DC | PRN
  Administered 2016-05-05 (×2): via INTRAVENOUS

## 2016-05-05 MED ORDER — ONDANSETRON HCL 4 MG/2ML IJ SOLN
INTRAMUSCULAR | Status: DC | PRN
  Administered 2016-05-05: 4 mg via INTRAVENOUS

## 2016-05-05 MED ORDER — SODIUM CHLORIDE 0.9% FLUSH: 3.0000 mL | Freq: Two times a day (BID) | INTRAVENOUS | Status: DC

## 2016-05-05 MED ORDER — ACETAMINOPHEN 325 MG PO TABS: 650.0000 mg | ORAL_TABLET | ORAL | Status: DC | PRN

## 2016-05-05 MED ORDER — BUPIVACAINE HCL (PF) 0.25 % IJ SOLN
INTRAMUSCULAR | Status: AC
  Filled 2016-05-05: qty 30

## 2016-05-05 MED ORDER — LACTATED RINGERS IV SOLN
INTRAVENOUS | Status: DC
  Administered 2016-05-05: 10:00:00 via INTRAVENOUS

## 2016-05-05 MED ORDER — ACETAMINOPHEN 650 MG RE SUPP: 650.0000 mg | RECTAL | Status: DC | PRN

## 2016-05-05 MED ORDER — OXYCODONE-ACETAMINOPHEN 5-325 MG PO TABS: 1.0000 | ORAL_TABLET | ORAL | Status: AC | PRN

## 2016-05-05 MED ORDER — METOCLOPRAMIDE HCL 5 MG/ML IJ SOLN
INTRAMUSCULAR | Status: AC
  Filled 2016-05-05: qty 2

## 2016-05-05 MED ORDER — FENTANYL CITRATE (PF) 100 MCG/2ML IJ SOLN
INTRAMUSCULAR | Status: DC | PRN
  Administered 2016-05-05: 100 ug via INTRAVENOUS
  Administered 2016-05-05: 50 ug via INTRAVENOUS
  Administered 2016-05-05: 100 ug via INTRAVENOUS

## 2016-05-05 MED ORDER — FENTANYL CITRATE (PF) 100 MCG/2ML IJ SOLN
INTRAMUSCULAR | Status: AC
  Filled 2016-05-05: qty 2

## 2016-05-05 MED ORDER — MORPHINE SULFATE (PF) 2 MG/ML IV SOLN
INTRAVENOUS | Status: AC
  Filled 2016-05-05: qty 1

## 2016-05-05 MED ORDER — LIDOCAINE HCL (CARDIAC) 20 MG/ML IV SOLN
INTRAVENOUS | Status: DC | PRN
  Administered 2016-05-05: 100 mg via INTRAVENOUS

## 2016-05-05 MED ORDER — ONDANSETRON HCL 4 MG/2ML IJ SOLN
INTRAMUSCULAR | Status: AC
  Filled 2016-05-05: qty 2

## 2016-05-05 MED ORDER — DEXAMETHASONE SODIUM PHOSPHATE 10 MG/ML IJ SOLN
INTRAMUSCULAR | Status: DC | PRN
  Administered 2016-05-05: 10 mg via INTRAVENOUS

## 2016-05-05 MED ORDER — MIDAZOLAM HCL 2 MG/2ML IJ SOLN
INTRAMUSCULAR | Status: AC
  Filled 2016-05-05: qty 2

## 2016-05-05 MED ORDER — SODIUM CHLORIDE 0.9 % IV SOLN: 250.0000 mL | INTRAVENOUS | Status: DC | PRN

## 2016-05-05 MED ORDER — MIDAZOLAM HCL 5 MG/5ML IJ SOLN
INTRAMUSCULAR | Status: DC | PRN
  Administered 2016-05-05: 2 mg via INTRAVENOUS

## 2016-05-05 MED ORDER — 0.9 % SODIUM CHLORIDE (POUR BTL) OPTIME
TOPICAL | Status: DC | PRN
  Administered 2016-05-05: 1000 mL

## 2016-05-05 MED ORDER — OXYCODONE HCL 5 MG PO TABS
ORAL_TABLET | ORAL | Status: AC
  Filled 2016-05-05: qty 2

## 2016-05-05 MED ORDER — ROCURONIUM BROMIDE 100 MG/10ML IV SOLN
INTRAVENOUS | Status: DC | PRN
  Administered 2016-05-05: 10 mg via INTRAVENOUS
  Administered 2016-05-05: 40 mg via INTRAVENOUS

## 2016-05-05 MED ORDER — OXYCODONE HCL 5 MG PO TABS
5.0000 mg | ORAL_TABLET | ORAL | Status: DC | PRN
  Administered 2016-05-05: 10 mg via ORAL

## 2016-05-05 MED ORDER — DEXAMETHASONE SODIUM PHOSPHATE 10 MG/ML IJ SOLN
INTRAMUSCULAR | Status: AC
  Filled 2016-05-05: qty 1

## 2016-05-05 MED ORDER — CEFAZOLIN SODIUM-DEXTROSE 2-4 GM/100ML-% IV SOLN
2.0000 g | INTRAVENOUS | Status: AC
  Administered 2016-05-05: 2 g via INTRAVENOUS
  Filled 2016-05-05: qty 100

## 2016-05-05 SURGICAL SUPPLY — 46 items
APL SKNCLS STERI-STRIP NONHPOA (GAUZE/BANDAGES/DRESSINGS) ×1
BAG SPEC RTRVL 10 TROC 200 (ENDOMECHANICALS)
BENZOIN TINCTURE PRP APPL 2/3 (GAUZE/BANDAGES/DRESSINGS) ×3 IMPLANT
CANISTER SUCTION 2500CC (MISCELLANEOUS) ×3 IMPLANT
CHLORAPREP W/TINT 26ML (MISCELLANEOUS) ×3 IMPLANT
CLIP LIGATING HEMO O LOK GREEN (MISCELLANEOUS) ×5 IMPLANT
CLOSURE WOUND 1/2 X4 (GAUZE/BANDAGES/DRESSINGS) ×1
COVER SURGICAL LIGHT HANDLE (MISCELLANEOUS) ×3 IMPLANT
COVER TRANSDUCER ULTRASND (DRAPES) ×3 IMPLANT
DEVICE TROCAR PUNCTURE CLOSURE (ENDOMECHANICALS) ×3 IMPLANT
ELECT REM PT RETURN 9FT ADLT (ELECTROSURGICAL) ×3
ELECTRODE REM PT RTRN 9FT ADLT (ELECTROSURGICAL) ×1 IMPLANT
GAUZE SPONGE 2X2 8PLY STRL LF (GAUZE/BANDAGES/DRESSINGS) ×1 IMPLANT
GLOVE BIO SURGEON STRL SZ7.5 (GLOVE) ×3 IMPLANT
GLOVE BIOGEL PI IND STRL 7.0 (GLOVE) IMPLANT
GLOVE BIOGEL PI IND STRL 8 (GLOVE) IMPLANT
GLOVE BIOGEL PI INDICATOR 7.0 (GLOVE) ×2
GLOVE BIOGEL PI INDICATOR 8 (GLOVE) ×2
GLOVE ECLIPSE 7.5 STRL STRAW (GLOVE) ×2 IMPLANT
GLOVE SURG SS PI 6.5 STRL IVOR (GLOVE) ×2 IMPLANT
GOWN STRL REUS W/ TWL LRG LVL3 (GOWN DISPOSABLE) ×2 IMPLANT
GOWN STRL REUS W/ TWL XL LVL3 (GOWN DISPOSABLE) ×1 IMPLANT
GOWN STRL REUS W/TWL LRG LVL3 (GOWN DISPOSABLE) ×6
GOWN STRL REUS W/TWL XL LVL3 (GOWN DISPOSABLE) ×3
KIT BASIN OR (CUSTOM PROCEDURE TRAY) ×3 IMPLANT
KIT ROOM TURNOVER OR (KITS) ×3 IMPLANT
NDL INSUFFLATION 14GA 120MM (NEEDLE) ×1 IMPLANT
NEEDLE INSUFFLATION 14GA 120MM (NEEDLE) ×3 IMPLANT
NS IRRIG 1000ML POUR BTL (IV SOLUTION) ×3 IMPLANT
PAD ARMBOARD 7.5X6 YLW CONV (MISCELLANEOUS) ×6 IMPLANT
POUCH RETRIEVAL ECOSAC 10 (ENDOMECHANICALS) IMPLANT
POUCH RETRIEVAL ECOSAC 10MM (ENDOMECHANICALS)
SCISSORS LAP 5X35 DISP (ENDOMECHANICALS) ×3 IMPLANT
SET IRRIG TUBING LAPAROSCOPIC (IRRIGATION / IRRIGATOR) ×3 IMPLANT
SLEEVE ENDOPATH XCEL 5M (ENDOMECHANICALS) ×5 IMPLANT
SOLUTION BETADINE 4OZ (MISCELLANEOUS) ×2 IMPLANT
SPECIMEN JAR SMALL (MISCELLANEOUS) ×3 IMPLANT
SPONGE GAUZE 2X2 STER 10/PKG (GAUZE/BANDAGES/DRESSINGS) ×2
STRIP CLOSURE SKIN 1/2X4 (GAUZE/BANDAGES/DRESSINGS) ×2 IMPLANT
SUT MNCRL AB 3-0 PS2 18 (SUTURE) ×3 IMPLANT
TOWEL OR 17X24 6PK STRL BLUE (TOWEL DISPOSABLE) ×3 IMPLANT
TOWEL OR 17X26 10 PK STRL BLUE (TOWEL DISPOSABLE) ×3 IMPLANT
TRAY LAPAROSCOPIC MC (CUSTOM PROCEDURE TRAY) ×3 IMPLANT
TROCAR XCEL NON-BLD 11X100MML (ENDOMECHANICALS) ×3 IMPLANT
TROCAR XCEL NON-BLD 5MMX100MML (ENDOMECHANICALS) ×3 IMPLANT
TUBING INSUFFLATION (TUBING) ×3 IMPLANT

## 2016-05-05 NOTE — Interval H&P Note (Signed)
History and Physical Interval Note:  05/05/2016 7:00 AM  Karen Mullins  has presented today for surgery, with the diagnosis of Gallstones  The various methods of treatment have been discussed with the patient and family. After consideration of risks, benefits and other options for treatment, the patient has consented to  Procedure(s): LAPAROSCOPIC CHOLECYSTECTOMY (N/A) as a surgical intervention .  The patient's history has been reviewed, patient examined, no change in status, stable for surgery.  I have reviewed the patient's chart and labs.  Questions were answered to the patient's satisfaction.     Marigene Ehlersamirez Jr., Jed LimerickArmando

## 2016-05-05 NOTE — Op Note (Signed)
05/05/2016  8:36 AM  PATIENT:  Vena AustriaNikki Yusko  40 y.o. female  PRE-OPERATIVE DIAGNOSIS:  Gallstones  POST-OPERATIVE DIAGNOSIS:  Symptomatic Gallstones, chronic cholecystitis  PROCEDURE:  Procedure(s): LAPAROSCOPIC CHOLECYSTECTOMY (N/A)  SURGEON:  Surgeon(s) and Role:    * Axel FillerArmando Dora Clauss, MD - Primary  ANESTHESIA:   local and general  EBL: <5cc Total I/O In: 1000 [I.V.:1000] Out: -   BLOOD ADMINISTERED:none  DRAINS: none   LOCAL MEDICATIONS USED:  BUPIVICAINE   SPECIMEN:  Source of Specimen:  gallbladder  DISPOSITION OF SPECIMEN:  PATHOLOGY  COUNTS:  YES  TOURNIQUET:  * No tourniquets in log *  DICTATION: .Dragon Dictation The patient was taken to the operating and placed in the supine position with bilateral SCDs in place. The patient was prepped and draped in the usual sterile fashion. A time out was called and all facts were verified. A pneumoperitoneum was obtained via A Veress needle technique to a pressure of 14mm of mercury.  A 5mm trochar was then placed in the right upper quadrant under visualization, and there were no injuries to any abdominal organs. A 11 mm port was then placed in the umbilical region after infiltrating with local anesthesia under direct visualization. A second and third epigastric port and right lower quadrant port placement under direct visualization, respectively. The gallbladder was identified and retracted, the peritoneum was then sharply dissected from the gallbladder and this dissection was carried down to Calot's triangle. The gallbladder was identified and stripped away circumferentially and seen going into the gallbladder 360, the critical angle was obtained.  2 clips were placed proximally one distally and the cystic duct transected. The cystic artery was identified and 2 clips placed proximally and one distally and transected. We then proceeded to remove the gallbladder off the hepatic fossa with Bovie cautery. A retrieval bag was then  placed in the abdomen and gallbladder placed in the bag. The hepatic fossa was then reexamined and hemostasis was achieved with Bovie cautery and was excellent at the end of the case. The subhepatic fossa and perihepatic fossa was then irrigated until the effluent was clear.  The gallbladder and bag were removed from the abdominal cavity. The 11 mm trocar fascia was reapproximated with the Endo Close #1 Vicryl. The pneumoperitoneum was evacuated and all trochars removed under direct visulalization. The skin was then closed with 4-0 Monocryl and the skin dressed with Steri-Strips, gauze, and tape. The patient was awaken from general anesthesia and taken to the recovery room in stable condition.   PLAN OF CARE: Discharge to home after PACU  PATIENT DISPOSITION:  PACU - hemodynamically stable.   Delay start of Pharmacological VTE agent (>24hrs) due to surgical blood loss or risk of bleeding: not applicable

## 2016-05-05 NOTE — Discharge Instructions (Signed)
[facility] ______[facility] °LAPAROSCOPIC SURGERY: POST OP INSTRUCTIONS °Always review your discharge instruction sheet given to you by the facility where your surgery was performed. °IF YOU HAVE DISABILITY OR FAMILY LEAVE FORMS, YOU MUST BRING THEM TO THE OFFICE FOR PROCESSING.   °DO NOT GIVE THEM TO YOUR DOCTOR. ° °1. A prescription for pain medication may be given to you upon discharge.  Take your pain medication as prescribed, if needed.  If narcotic pain medicine is not needed, then you may take acetaminophen (Tylenol) or ibuprofen (Advil) as needed. °2. Take your usually prescribed medications unless otherwise directed. °3. If you need a refill on your pain medication, please contact your pharmacy.  They will contact our office to request authorization. Prescriptions will not be filled after 5pm or on week-ends. °4. You should follow a light diet the first few days after arrival home, such as soup and crackers, etc.  Be sure to include lots of fluids daily. °5. Most patients will experience some swelling and bruising in the area of the incisions.  Ice packs will help.  Swelling and bruising can take several days to resolve.  °6. It is common to experience some constipation if taking pain medication after surgery.  Increasing fluid intake and taking a stool softener (such as Colace) will usually help or prevent this problem from occurring.  A mild laxative (Milk of Magnesia or Miralax) should be taken according to package instructions if there are no bowel movements after 48 hours. °7. Unless discharge instructions indicate otherwise, you may remove your bandages 24-48 hours after surgery, and you may shower at that time.  You may have steri-strips (small skin tapes) in place directly over the incision.  These strips should be left on the skin for 7-10 days.  If your surgeon used skin glue on the incision, you may shower in 24 hours.  The glue will flake off over the next 2-3 weeks.  Any sutures or  staples will be removed at the office during your follow-up visit. °8. ACTIVITIES:  You may resume regular (light) daily activities beginning the next day--such as daily self-care, walking, climbing stairs--gradually increasing activities as tolerated.  You may have sexual intercourse when it is comfortable.  Refrain from any heavy lifting or straining until approved by your doctor. °a. You may drive when you are no longer taking prescription pain medication, you can comfortably wear a seatbelt, and you can safely maneuver your car and apply brakes. °b. RETURN TO WORK:  __________________________________________________________ °9. You should see your doctor in the office for a follow-up appointment approximately 2-3 weeks after your surgery.  Make sure that you call for this appointment within a day or two after you arrive home to insure a convenient appointment time. °10. OTHER INSTRUCTIONS: __________________________________________________________________________________________________________________________ __________________________________________________________________________________________________________________________ °WHEN TO CALL YOUR DOCTOR: °1. Fever over 101.0 °2. Inability to urinate °3. Continued bleeding from incision. °4. Increased pain, redness, or drainage from the incision. °5. Increasing abdominal pain ° °The clinic staff is available to answer your questions during regular business hours.  Please don’t hesitate to call and ask to speak to one of the nurses for clinical concerns.  If you have a medical emergency, go to the nearest emergency room or call 911.  A surgeon from [facility] is always on call at the hospital. °[address], [address], [place], [place]  [address] ? [address] [address], [place], [place]   [address] °[phone] ? [phone] ? [phone] [phone] °Web site:   [email or web address] °

## 2016-05-05 NOTE — Anesthesia Procedure Notes (Signed)
Procedure Name: Intubation Date/Time: 05/05/2016 7:42 AM Performed by: Arlice ColtMANESS, Melda Mermelstein B Pre-anesthesia Checklist: Patient identified, Emergency Drugs available, Suction available, Patient being monitored and Timeout performed Patient Re-evaluated:Patient Re-evaluated prior to inductionOxygen Delivery Method: Circle system utilized Preoxygenation: Pre-oxygenation with 100% oxygen Intubation Type: IV induction Ventilation: Mask ventilation without difficulty Laryngoscope Size: Mac and 3 Grade View: Grade I Tube type: Oral Tube size: 7.5 mm Number of attempts: 1 Airway Equipment and Method: Stylet Placement Confirmation: ETT inserted through vocal cords under direct vision,  positive ETCO2 and breath sounds checked- equal and bilateral Secured at: 20 cm Tube secured with: Tape Dental Injury: Teeth and Oropharynx as per pre-operative assessment

## 2016-05-05 NOTE — Anesthesia Preprocedure Evaluation (Addendum)
Anesthesia Evaluation  Patient identified by MRN, date of birth, ID band Patient awake    Reviewed: Allergy & Precautions, NPO status , Patient's Chart, lab work & pertinent test results  Airway Mallampati: II  TM Distance: >3 FB Neck ROM: Full    Dental no notable dental hx.    Pulmonary neg pulmonary ROS,    Pulmonary exam normal breath sounds clear to auscultation       Cardiovascular negative cardio ROS Normal cardiovascular exam Rhythm:Regular Rate:Normal     Neuro/Psych negative neurological ROS  negative psych ROS   GI/Hepatic negative GI ROS, Neg liver ROS,   Endo/Other  negative endocrine ROS  Renal/GU negative Renal ROS  negative genitourinary   Musculoskeletal negative musculoskeletal ROS (+)   Abdominal   Peds negative pediatric ROS (+)  Hematology negative hematology ROS (+)   Anesthesia Other Findings   Reproductive/Obstetrics negative OB ROS                             Anesthesia Physical Anesthesia Plan  ASA: I  Anesthesia Plan: General   Post-op Pain Management:    Induction: Intravenous  Airway Management Planned: Oral ETT  Additional Equipment:   Intra-op Plan:   Post-operative Plan: Extubation in OR  Informed Consent: I have reviewed the patients History and Physical, chart, labs and discussed the procedure including the risks, benefits and alternatives for the proposed anesthesia with the patient or authorized representative who has indicated his/her understanding and acceptance.   Dental advisory given  Plan Discussed with: CRNA  Anesthesia Plan Comments:         Anesthesia Quick Evaluation  

## 2016-05-05 NOTE — Transfer of Care (Signed)
Immediate Anesthesia Transfer of Care Note  Patient: Karen Mullins  Procedure(s) Performed: Procedure(s): LAPAROSCOPIC CHOLECYSTECTOMY (N/A)  Patient Location: PACU  Anesthesia Type:General  Level of Consciousness: awake, alert  and oriented  Airway & Oxygen Therapy: Patient Spontanous Breathing and Patient connected to nasal cannula oxygen  Post-op Assessment: Report given to RN and Post -op Vital signs reviewed and stable  Post vital signs: Reviewed and stable  Last Vitals:  Filed Vitals:   05/05/16 0620  BP: 112/69  Pulse: 68  Temp: 37 C  Resp: 18    Last Pain: There were no vitals filed for this visit.    Patients Stated Pain Goal: 3 (05/05/16 [id number])  Complications: No apparent anesthesia complications

## 2016-05-05 NOTE — H&P (View-Only) (Signed)
History of Present Illness Karen Filler MD; 04/28/2016 9:16 AM) The patient is a 40 year old female who presents for evaluation of gall stones. The patient is a 40 year old female who is referred by most common ER for evaluation of symptomatic stones. Patient had a long history of abdominal pain, nausea vomiting, and diarrhea. Patient states that penicillin to the ER began at 5 AM. She states that she did have some pizza over the weekend. Patient also states that she has some associated diarrhea since that time. She's been nauseated, no vomiting. Patient's LFTs were within normal limits  Patient underwent ultrasound. Results below [place] results: Approximately 0.7 cm adherent stone versus polyp within otherwise normal-appearing gallbladder. If clinical concern persists for acute cholecystitis, further evaluation could be performed with nuclear medicine HIDA scan as indicated. As this structure may indeed represent a gallbladder polyp, if interval cholecystectomy is not pursued, a follow-up ultrasound is recommended in 1 year to ensure stability.   Other Problems [name], CMA; 04/28/2016 8:55 AM) Anxiety Disorder Back Pain Depression Gastroesophageal Reflux Disease Other disease, cancer, significant illness  Past Surgical History [name], CMA; 04/28/2016 8:55 AM) No pertinent past surgical history  Diagnostic Studies History [name], CMA; 04/28/2016 8:55 AM) Colonoscopy never Mammogram never Pap Smear 1-5 years ago  Allergies [name], CMA; 04/28/2016 8:56 AM) No Known Drug Allergies05/03/2016  Medication History ([name], CMA; 04/28/2016 8:57 AM) Phentermine HCl (37.5MG  Tablet, Oral) Active. Drospirenone-Ethinyl Estradiol (3-0.03MG  Tablet, Oral) Active. BuPROPion HCl ER (XL) (  Tablet ER 24HR, Oral) Active. Hydrocodone-Acetaminophen (5-325MG  Tablet, Oral as needed) Active. Ondansetron (  Tablet Disperse, Oral as needed) Active. Advil  (  Tablet, Oral as needed) Active. Naproxen (  Tablet, Oral as needed) Active. ZyrTEC Allergy (  Capsule, Oral) Active. Flonase (50MCG/ACT Suspension, Nasal as needed) Active. Medications Reconciled  Social History [name], CMA; 04/28/2016 8:55 AM) Alcohol use Occasional alcohol use. Caffeine use Carbonated beverages, Tea. Illicit drug use Remotely quit drug use. Tobacco use Never smoker.  Family History [name], CMA; 04/28/2016 8:55 AM) Cancer Mother. Colon Polyps Father. Depression Father. Diabetes Mellitus Father. Hypertension Father. Migraine Headache Mother.  Pregnancy / Birth History [name], CMA; 04/28/2016 8:55 AM) Age at menarche 13 years. Contraceptive History Oral contraceptives. Gravida 0 Para 0 Regular periods    Review of Systems ([name] CMA; 04/28/2016 8:55 AM) General Present- Appetite Loss, Chills and Fatigue. Not Present- Fever, Night Sweats, Weight Gain and Weight Loss. Skin Not Present- Change in Wart/Mole, Dryness, Hives, Jaundice, New Lesions, Non-Healing Wounds, Rash and Ulcer. HEENT Not Present- Earache, Hearing Loss, Hoarseness, Nose Bleed, Oral Ulcers, Ringing in the Ears, Seasonal Allergies, Sinus Pain, Sore Throat, Visual Disturbances, Wears glasses/contact lenses and Yellow Eyes. Respiratory Not Present- Bloody sputum, Chronic Cough, Difficulty Breathing, Snoring and Wheezing. Breast Not Present- Breast Mass, Breast Pain, Nipple Discharge and Skin Changes. Cardiovascular Not Present- Chest Pain, Difficulty Breathing Lying Down, Leg Cramps, Palpitations, Rapid Heart Rate, Shortness of Breath and Swelling of Extremities. Gastrointestinal Present- Abdominal Pain, Bloating, Change in Bowel Habits, Gets full quickly at meals, Indigestion and Nausea. Not Present- Bloody Stool, Chronic diarrhea, Constipation, Difficulty Swallowing, Excessive gas, Hemorrhoids, Rectal Pain and Vomiting. Female Genitourinary Not  Present- Frequency, Nocturia, Painful Urination, Pelvic Pain and Urgency. Musculoskeletal Present- Back Pain. Not Present- Joint Pain, Joint Stiffness, Muscle Pain, Muscle Weakness and Swelling of Extremities. Neurological Not Present- Decreased Memory, Fainting, Headaches, Numbness, Seizures, Tingling, Tremor, Trouble walking and Weakness. Psychiatric Not Present- Anxiety, Bipolar, Change in Sleep Pattern, Depression, Fearful and  Frequent crying. Endocrine Not Present- Cold Intolerance, Excessive Hunger, Hair Changes, Heat Intolerance, Hot flashes and New Diabetes. Hematology Not Present- Easy Bruising, Excessive bleeding, Gland problems, HIV and Persistent Infections.  Vitals ([name] CMA; 04/28/2016 8:55 AM) 04/28/2016 8:55 AM Weight: 179 lb Height: 64in Body Surface Area: 1.87 m Body Mass Index: 30.72 kg/m  Temp.: 97.74F(Temporal)  Pulse: 81 (Regular)  BP: 134/74 (Sitting, Left Arm, Standard)       Physical Exam Karen Mullins(Mercy Malena, MD; 04/28/2016 9:17 AM) General Mental Status-Alert. General Appearance-Consistent with stated age. Hydration-Well hydrated. Voice-Normal.  Head and Neck Head-normocephalic, atraumatic with no lesions or palpable masses.  Eye Eyeball - Bilateral-Extraocular movements intact. Sclera/Conjunctiva - Bilateral-No scleral icterus.  Chest and Lung Exam Chest and lung exam reveals -quiet, even and easy respiratory effort with no use of accessory muscles. Inspection Chest Wall - Normal. Back - normal.  Cardiovascular Cardiovascular examination reveals -normal heart sounds, regular rate and rhythm with no murmurs.  Abdomen Inspection Normal Exam - No Hernias. Palpation/Percussion Normal exam - Soft, Non Tender, No Rebound tenderness, No Rigidity (guarding) and No hepatosplenomegaly. Auscultation Normal exam - Bowel sounds normal.  Neurologic Neurologic evaluation reveals -alert and oriented x 3 with no impairment  of recent or remote memory. Mental Status-Normal.  Musculoskeletal Normal Exam - Left-Upper Extremity Strength Normal and Lower Extremity Strength Normal. Normal Exam - Right-Upper Extremity Strength Normal, Lower Extremity Weakness.    Assessment & Plan Karen Mullins(Kamiya Acord MD; 04/28/2016 9:17 AM) SYMPTOMATIC CHOLELITHIASIS (K80.20) Impression: 40 year old female with symptomatic cholelithiasis  1. We will proceed to the operating room for a laparoscopic cholecystectomy 2. Risks and benefits were discussed with the patient to generally include, but not limited to: infection, bleeding, possible need for post op ERCP, damage to the bile ducts, bile leak, and possible need for further surgery. Alternatives were offered and described. All questions were answered and the patient voiced understanding of the procedure and wishes to proceed at this point with a laparoscopic cholecystectomy

## 2016-05-06 ENCOUNTER — Encounter (HOSPITAL_COMMUNITY): Payer: Self-pay | Admitting: General Surgery

## 2016-05-06 NOTE — Anesthesia Postprocedure Evaluation (Signed)
Anesthesia Post Note  Patient: Karen Mullins  Procedure(s) Performed: Procedure(s) (LRB): LAPAROSCOPIC CHOLECYSTECTOMY (N/A)  Patient location during evaluation: PACU Anesthesia Type: General Level of consciousness: awake and alert Pain management: pain level controlled Vital Signs Assessment: post-procedure vital signs reviewed and stable Respiratory status: spontaneous breathing, nonlabored ventilation, respiratory function stable and patient connected to nasal cannula oxygen Cardiovascular status: blood pressure returned to baseline and stable Postop Assessment: no signs of nausea or vomiting Anesthetic complications: no     Last Vitals:  Filed Vitals:   05/05/16 1025 05/05/16 1039  BP:  131/81  Pulse:  81  Temp: 36.4 C   Resp:      Last Pain:  Filed Vitals:   05/05/16 1042  PainSc: 0-No pain   Pain Goal: Patients Stated Pain Goal: 3 (05/05/16 0849)               Phillips Groutarignan, Jaquay Morneault

## 2017-11-15 ENCOUNTER — Other Ambulatory Visit: Payer: Self-pay | Admitting: Obstetrics and Gynecology

## 2017-12-14 IMAGING — US US ABDOMEN COMPLETE
1 series · 13 of 25 positions shown · non-contrast
Comparison: None.

CLINICAL DATA: Right upper quadrant abdominal pain today. History
of fever and elevated white blood cell count.

EXAM:
ABDOMEN ULTRASOUND COMPLETE

[Series 1: us abdomen complete · 0.20mm/px · 13 of 64 slices shown]
[im 1/64]
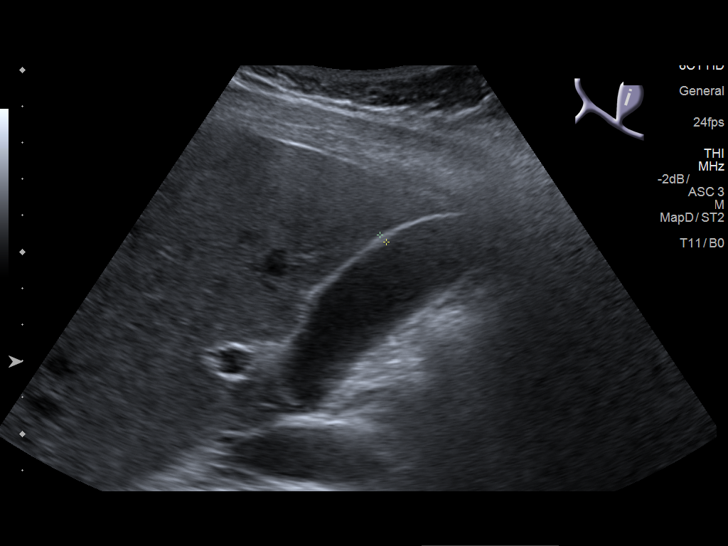
[im 6/64]
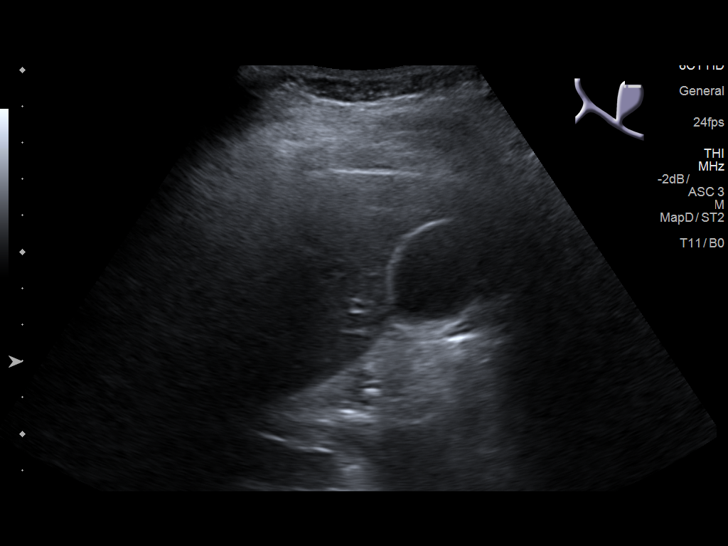
[im 11/64]
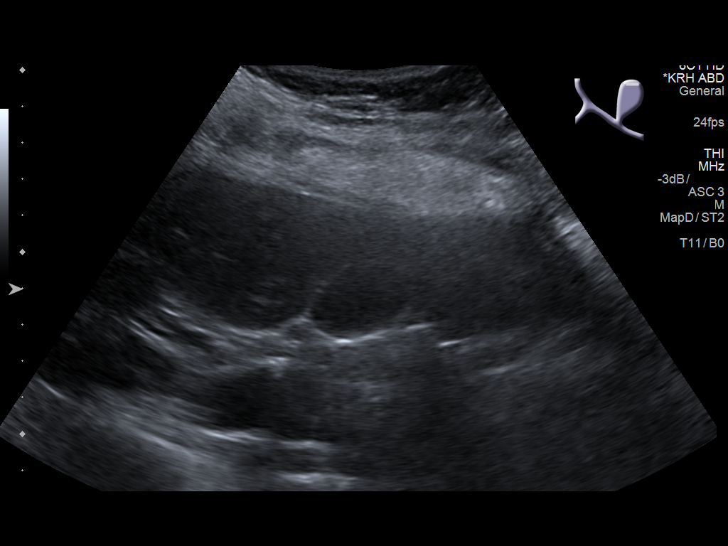
[im 16/64]
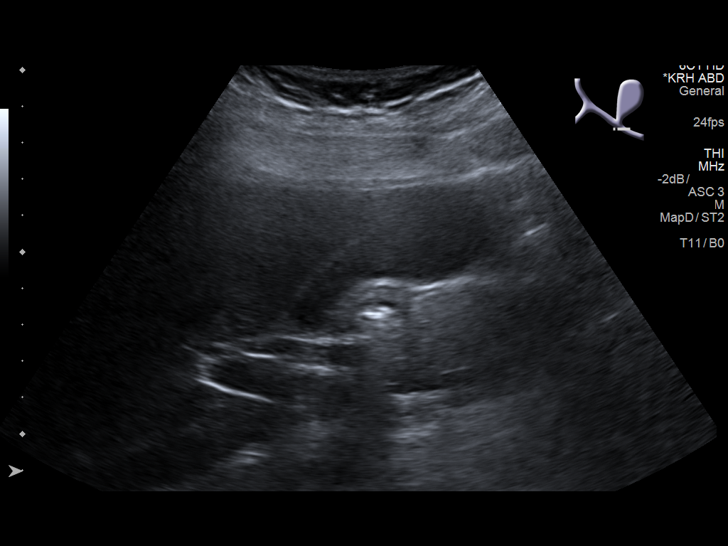
[im 22/64]
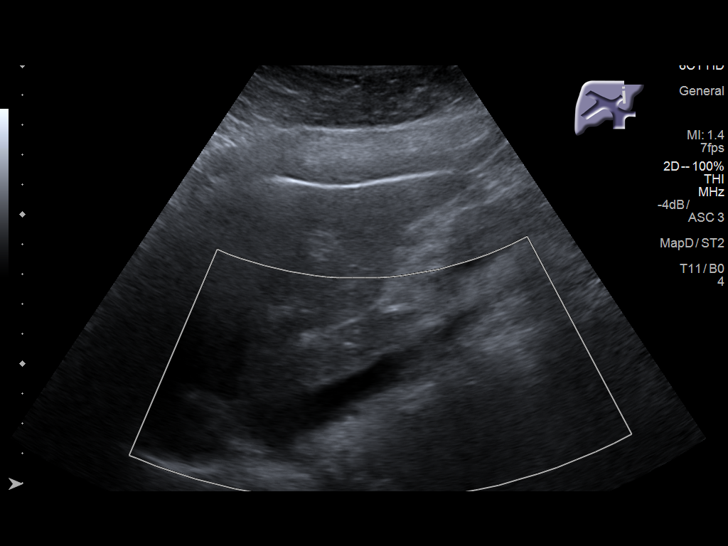
[im 27/64]
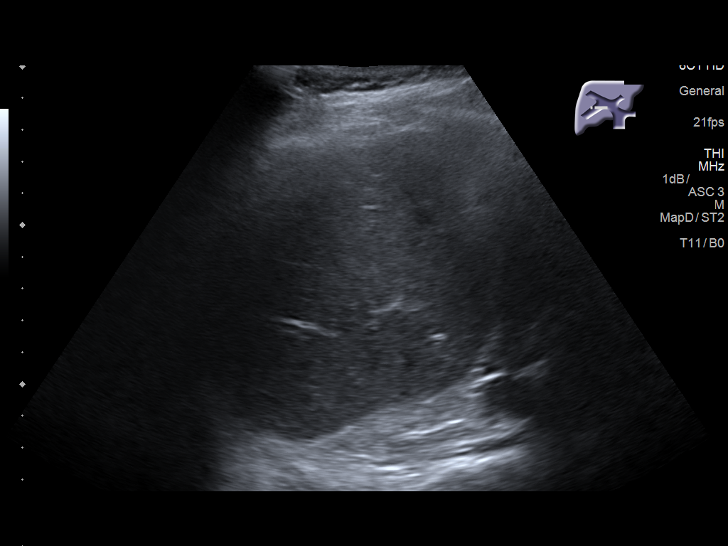
[im 32/64]
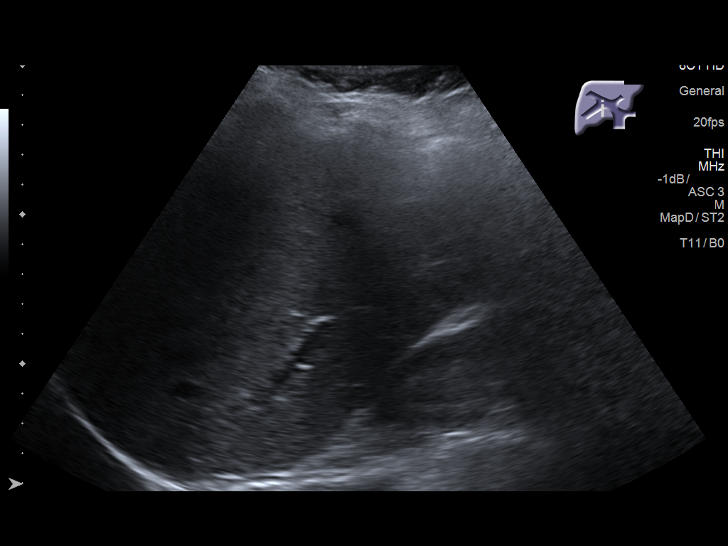
[im 37/64]
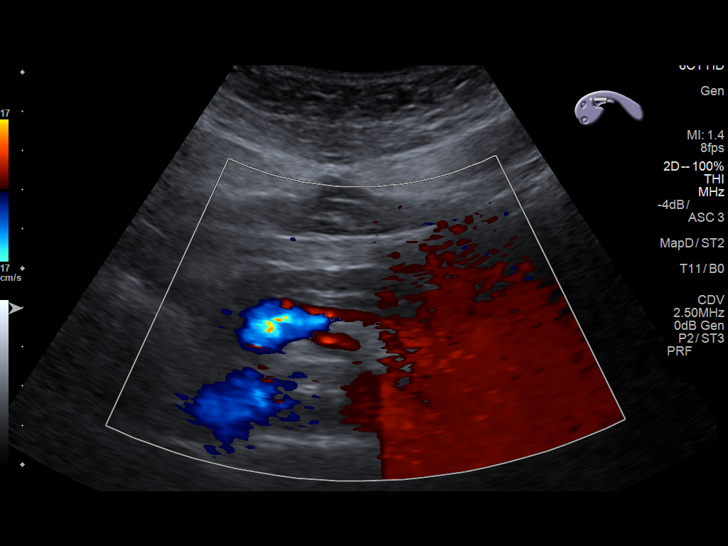
[im 43/64]
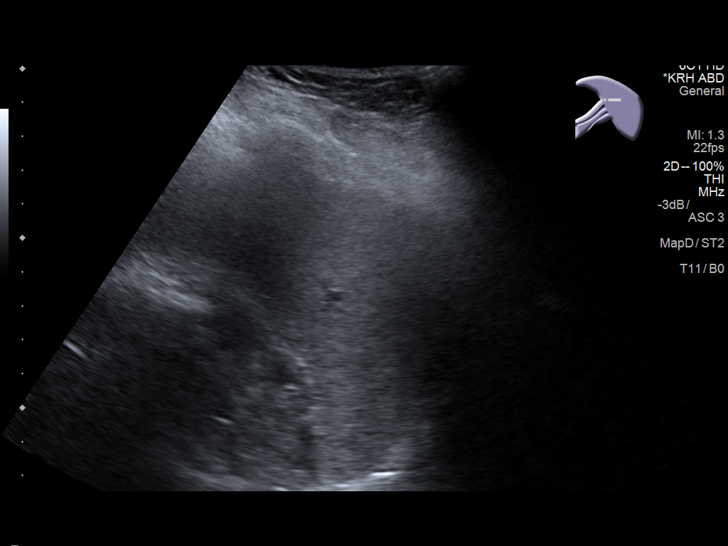
[im 48/64]
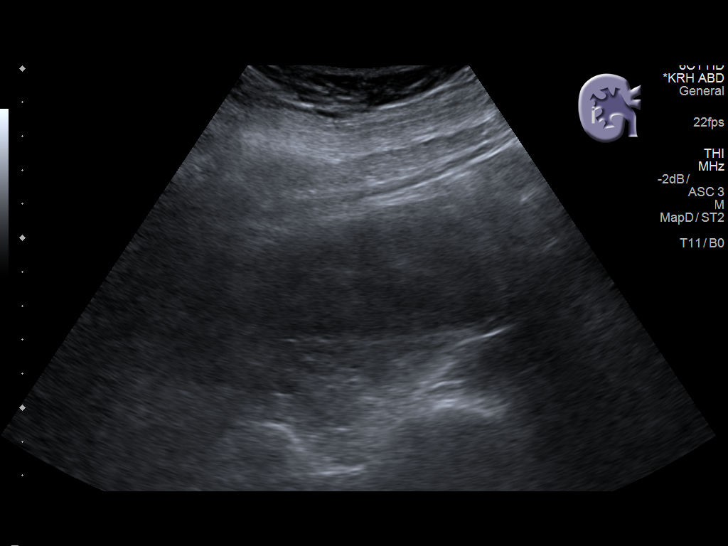
[im 53/64]
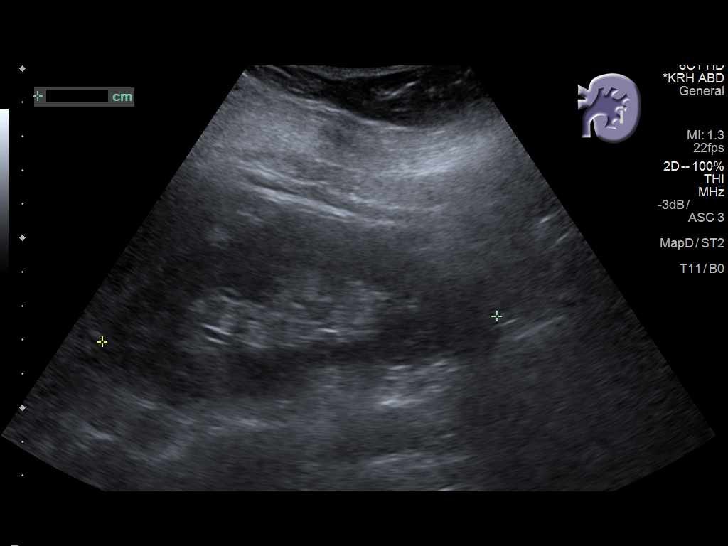
[im 58/64]
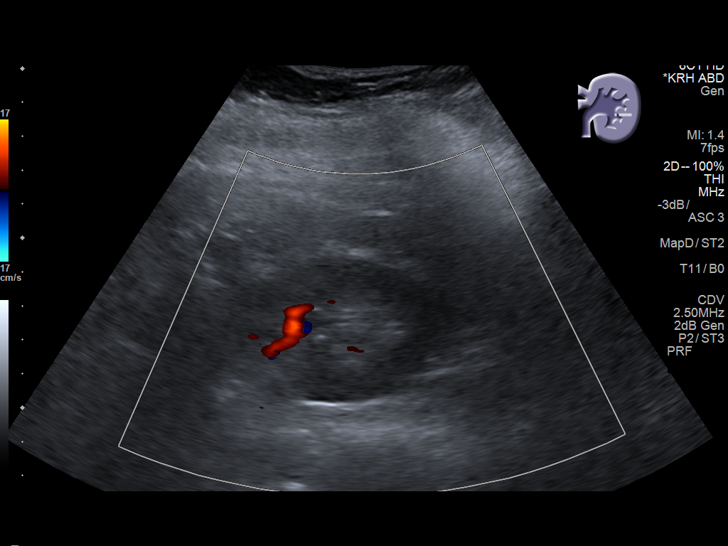
[im 64/64]
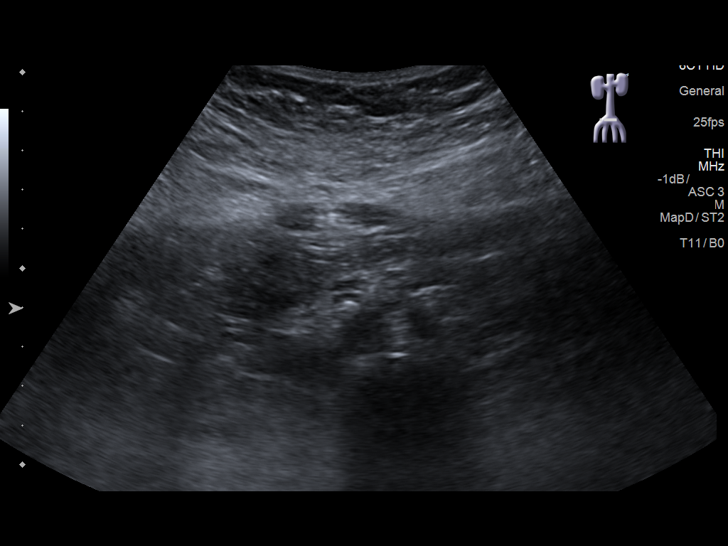

[13 of 25 positions shown; findings below may reference images not displayed]

FINDINGS: Gallbladder: There is an approximately 0.5 x 0.7 cm echogenic non
shadowing adherent stone versus polyp within otherwise
normal-appearing gallbladder. No gallbladder wall thickening or
pericholecystic fluid. Negative sonographic Murphy's sign.

Common bile duct: Diameter: Normal in size measuring 2.5 mm in
diameter

Liver: There is relative homogeneity of the hepatic parenchyma. No
discrete hepatic lesions. No definite evidence of intrahepatic
biliary ductal dilatation. No ascites.

IVC: No abnormality visualized.

Pancreas: Limited visualization of the pancreatic head and neck is
normal. Visualization of the pancreatic body and tail is obscured by
bowel gas.

Spleen: Normal in size measuring 10.5 cm in length

Right Kidney: Normal cortical thickness, echogenicity and size,
measuring 10.1 cm in length. No focal renal lesions. No echogenic
renal stones. No urinary obstruction.

Left Kidney: Normal cortical thickness, echogenicity and size,
measuring 11.7 cm in length. No focal renal lesions. No echogenic
renal stones. No urinary obstruction.

Abdominal aorta: No aneurysm visualized.

Other findings: None.
IMPRESSION: Approximately 0.7 cm adherent stone versus polyp within otherwise
normal-appearing gallbladder. If clinical concern persists for acute
cholecystitis, further evaluation could be performed with nuclear
medicine HIDA scan as indicated. As this structure may indeed
represent a gallbladder polyp, if interval cholecystectomy is not
pursued, a follow-up ultrasound is recommended in 1 year to ensure
stability.

## 2018-11-26 DIAGNOSIS — F4323 Adjustment disorder with mixed anxiety and depressed mood: Secondary | ICD-10-CM | POA: Diagnosis not present

## 2018-12-05 DIAGNOSIS — Z1213 Encounter for screening for malignant neoplasm of small intestine: Secondary | ICD-10-CM | POA: Diagnosis not present

## 2018-12-05 DIAGNOSIS — Z3040 Encounter for surveillance of contraceptives, unspecified: Secondary | ICD-10-CM | POA: Diagnosis not present

## 2018-12-05 DIAGNOSIS — Z6835 Body mass index (BMI) 35.0-35.9, adult: Secondary | ICD-10-CM | POA: Diagnosis not present

## 2018-12-05 DIAGNOSIS — Z1231 Encounter for screening mammogram for malignant neoplasm of breast: Secondary | ICD-10-CM | POA: Diagnosis not present

## 2018-12-05 DIAGNOSIS — Z01419 Encounter for gynecological examination (general) (routine) without abnormal findings: Secondary | ICD-10-CM | POA: Diagnosis not present

## 2018-12-05 DIAGNOSIS — B977 Papillomavirus as the cause of diseases classified elsewhere: Secondary | ICD-10-CM | POA: Diagnosis not present

## 2019-01-11 DIAGNOSIS — R928 Other abnormal and inconclusive findings on diagnostic imaging of breast: Secondary | ICD-10-CM | POA: Diagnosis not present

## 2019-07-03 DIAGNOSIS — L905 Scar conditions and fibrosis of skin: Secondary | ICD-10-CM | POA: Diagnosis not present

## 2019-07-03 DIAGNOSIS — D485 Neoplasm of uncertain behavior of skin: Secondary | ICD-10-CM | POA: Diagnosis not present

## 2019-07-03 DIAGNOSIS — D225 Melanocytic nevi of trunk: Secondary | ICD-10-CM | POA: Diagnosis not present

## 2019-07-03 DIAGNOSIS — D2261 Melanocytic nevi of right upper limb, including shoulder: Secondary | ICD-10-CM | POA: Diagnosis not present

## 2019-07-03 DIAGNOSIS — D224 Melanocytic nevi of scalp and neck: Secondary | ICD-10-CM | POA: Diagnosis not present

## 2019-07-03 DIAGNOSIS — D2262 Melanocytic nevi of left upper limb, including shoulder: Secondary | ICD-10-CM | POA: Diagnosis not present

## 2019-08-20 DIAGNOSIS — Z23 Encounter for immunization: Secondary | ICD-10-CM | POA: Diagnosis not present

## 2019-08-20 DIAGNOSIS — Z13220 Encounter for screening for lipoid disorders: Secondary | ICD-10-CM | POA: Diagnosis not present

## 2019-08-20 DIAGNOSIS — Z0000 Encounter for general adult medical examination without abnormal findings: Secondary | ICD-10-CM | POA: Diagnosis not present

## 2020-01-16 DIAGNOSIS — Z6835 Body mass index (BMI) 35.0-35.9, adult: Secondary | ICD-10-CM | POA: Diagnosis not present

## 2020-01-16 DIAGNOSIS — Z3040 Encounter for surveillance of contraceptives, unspecified: Secondary | ICD-10-CM | POA: Diagnosis not present

## 2020-01-16 DIAGNOSIS — Z01419 Encounter for gynecological examination (general) (routine) without abnormal findings: Secondary | ICD-10-CM | POA: Diagnosis not present

## 2020-02-20 DIAGNOSIS — E781 Pure hyperglyceridemia: Secondary | ICD-10-CM | POA: Diagnosis not present

## 2020-02-20 DIAGNOSIS — K30 Functional dyspepsia: Secondary | ICD-10-CM | POA: Diagnosis not present

## 2020-02-20 DIAGNOSIS — E669 Obesity, unspecified: Secondary | ICD-10-CM | POA: Diagnosis not present

## 2020-02-20 DIAGNOSIS — F411 Generalized anxiety disorder: Secondary | ICD-10-CM | POA: Diagnosis not present

## 2020-02-27 DIAGNOSIS — R928 Other abnormal and inconclusive findings on diagnostic imaging of breast: Secondary | ICD-10-CM | POA: Diagnosis not present

## 2020-02-27 DIAGNOSIS — Z1231 Encounter for screening mammogram for malignant neoplasm of breast: Secondary | ICD-10-CM | POA: Diagnosis not present

## 2020-03-03 DIAGNOSIS — R5381 Other malaise: Secondary | ICD-10-CM | POA: Diagnosis not present

## 2020-03-03 DIAGNOSIS — R5383 Other fatigue: Secondary | ICD-10-CM | POA: Diagnosis not present

## 2020-05-15 DIAGNOSIS — R5381 Other malaise: Secondary | ICD-10-CM | POA: Diagnosis not present

## 2020-05-15 DIAGNOSIS — Z0189 Encounter for other specified special examinations: Secondary | ICD-10-CM | POA: Diagnosis not present

## 2020-05-15 DIAGNOSIS — R635 Abnormal weight gain: Secondary | ICD-10-CM | POA: Diagnosis not present

## 2020-05-15 DIAGNOSIS — R5383 Other fatigue: Secondary | ICD-10-CM | POA: Diagnosis not present

## 2020-07-28 DIAGNOSIS — R05 Cough: Secondary | ICD-10-CM | POA: Diagnosis not present

## 2020-08-25 DIAGNOSIS — Z03818 Encounter for observation for suspected exposure to other biological agents ruled out: Secondary | ICD-10-CM | POA: Diagnosis not present

## 2020-08-25 DIAGNOSIS — Z20822 Contact with and (suspected) exposure to covid-19: Secondary | ICD-10-CM | POA: Diagnosis not present

## 2020-09-02 DIAGNOSIS — D2262 Melanocytic nevi of left upper limb, including shoulder: Secondary | ICD-10-CM | POA: Diagnosis not present

## 2020-09-02 DIAGNOSIS — L820 Inflamed seborrheic keratosis: Secondary | ICD-10-CM | POA: Diagnosis not present

## 2020-09-02 DIAGNOSIS — D2261 Melanocytic nevi of right upper limb, including shoulder: Secondary | ICD-10-CM | POA: Diagnosis not present

## 2020-09-02 DIAGNOSIS — L814 Other melanin hyperpigmentation: Secondary | ICD-10-CM | POA: Diagnosis not present

## 2020-09-02 DIAGNOSIS — D225 Melanocytic nevi of trunk: Secondary | ICD-10-CM | POA: Diagnosis not present

## 2020-10-07 DIAGNOSIS — N6002 Solitary cyst of left breast: Secondary | ICD-10-CM | POA: Diagnosis not present

## 2020-10-07 DIAGNOSIS — R928 Other abnormal and inconclusive findings on diagnostic imaging of breast: Secondary | ICD-10-CM | POA: Diagnosis not present

## 2020-10-17 DIAGNOSIS — R5381 Other malaise: Secondary | ICD-10-CM | POA: Diagnosis not present

## 2020-10-17 DIAGNOSIS — E559 Vitamin D deficiency, unspecified: Secondary | ICD-10-CM | POA: Diagnosis not present

## 2020-10-17 DIAGNOSIS — D649 Anemia, unspecified: Secondary | ICD-10-CM | POA: Diagnosis not present

## 2020-10-17 DIAGNOSIS — E668 Other obesity: Secondary | ICD-10-CM | POA: Diagnosis not present

## 2021-01-18 DIAGNOSIS — Z01419 Encounter for gynecological examination (general) (routine) without abnormal findings: Secondary | ICD-10-CM | POA: Diagnosis not present

## 2021-01-18 DIAGNOSIS — Z3040 Encounter for surveillance of contraceptives, unspecified: Secondary | ICD-10-CM | POA: Diagnosis not present

## 2021-01-18 DIAGNOSIS — Z1239 Encounter for other screening for malignant neoplasm of breast: Secondary | ICD-10-CM | POA: Diagnosis not present

## 2021-01-18 DIAGNOSIS — Z6835 Body mass index (BMI) 35.0-35.9, adult: Secondary | ICD-10-CM | POA: Diagnosis not present

## 2021-02-07 DIAGNOSIS — Z20822 Contact with and (suspected) exposure to covid-19: Secondary | ICD-10-CM | POA: Diagnosis not present

## 2021-03-09 DIAGNOSIS — R928 Other abnormal and inconclusive findings on diagnostic imaging of breast: Secondary | ICD-10-CM | POA: Diagnosis not present

## 2021-03-09 DIAGNOSIS — Z1231 Encounter for screening mammogram for malignant neoplasm of breast: Secondary | ICD-10-CM | POA: Diagnosis not present

## 2021-03-21 DIAGNOSIS — Z713 Dietary counseling and surveillance: Secondary | ICD-10-CM | POA: Diagnosis not present

## 2021-03-21 DIAGNOSIS — E669 Obesity, unspecified: Secondary | ICD-10-CM | POA: Diagnosis not present

## 2021-07-15 DIAGNOSIS — F329 Major depressive disorder, single episode, unspecified: Secondary | ICD-10-CM | POA: Diagnosis not present

## 2021-07-15 DIAGNOSIS — R140 Abdominal distension (gaseous): Secondary | ICD-10-CM | POA: Diagnosis not present

## 2021-07-15 DIAGNOSIS — E781 Pure hyperglyceridemia: Secondary | ICD-10-CM | POA: Diagnosis not present

## 2021-07-15 DIAGNOSIS — F411 Generalized anxiety disorder: Secondary | ICD-10-CM | POA: Diagnosis not present

## 2021-07-21 DIAGNOSIS — Z20822 Contact with and (suspected) exposure to covid-19: Secondary | ICD-10-CM | POA: Diagnosis not present

## 2021-07-21 DIAGNOSIS — J029 Acute pharyngitis, unspecified: Secondary | ICD-10-CM | POA: Diagnosis not present

## 2021-07-29 DIAGNOSIS — R140 Abdominal distension (gaseous): Secondary | ICD-10-CM | POA: Diagnosis not present

## 2021-07-29 DIAGNOSIS — E781 Pure hyperglyceridemia: Secondary | ICD-10-CM | POA: Diagnosis not present

## 2021-10-27 DIAGNOSIS — Z20822 Contact with and (suspected) exposure to covid-19: Secondary | ICD-10-CM | POA: Diagnosis not present

## 2021-11-17 DIAGNOSIS — D224 Melanocytic nevi of scalp and neck: Secondary | ICD-10-CM | POA: Diagnosis not present

## 2021-11-17 DIAGNOSIS — I788 Other diseases of capillaries: Secondary | ICD-10-CM | POA: Diagnosis not present

## 2021-11-17 DIAGNOSIS — L720 Epidermal cyst: Secondary | ICD-10-CM | POA: Diagnosis not present

## 2021-11-17 DIAGNOSIS — L814 Other melanin hyperpigmentation: Secondary | ICD-10-CM | POA: Diagnosis not present

## 2022-01-25 DIAGNOSIS — Z3040 Encounter for surveillance of contraceptives, unspecified: Secondary | ICD-10-CM | POA: Diagnosis not present

## 2022-01-25 DIAGNOSIS — Z6837 Body mass index (BMI) 37.0-37.9, adult: Secondary | ICD-10-CM | POA: Diagnosis not present

## 2022-01-25 DIAGNOSIS — Z01419 Encounter for gynecological examination (general) (routine) without abnormal findings: Secondary | ICD-10-CM | POA: Diagnosis not present

## 2022-01-25 DIAGNOSIS — Z1231 Encounter for screening mammogram for malignant neoplasm of breast: Secondary | ICD-10-CM | POA: Diagnosis not present

## 2022-02-07 DIAGNOSIS — E669 Obesity, unspecified: Secondary | ICD-10-CM | POA: Diagnosis not present

## 2022-02-07 DIAGNOSIS — Z713 Dietary counseling and surveillance: Secondary | ICD-10-CM | POA: Diagnosis not present

## 2022-07-19 ENCOUNTER — Ambulatory Visit
Admission: RE | Admit: 2022-07-19 | Discharge: 2022-07-19 | Disposition: A | Discharge status: Home / Self Care | Payer: BC Managed Care – PPO | Source: Ambulatory Visit | Attending: Family Medicine | Admitting: Family Medicine

## 2022-07-19 ENCOUNTER — Other Ambulatory Visit: Payer: Self-pay | Admitting: Family Medicine

## 2022-07-19 DIAGNOSIS — R059 Cough, unspecified: Secondary | ICD-10-CM | POA: Diagnosis not present

## 2022-07-19 DIAGNOSIS — R052 Subacute cough: Secondary | ICD-10-CM | POA: Diagnosis not present

## 2022-07-19 DIAGNOSIS — R0689 Other abnormalities of breathing: Secondary | ICD-10-CM | POA: Diagnosis not present

## 2022-07-19 DIAGNOSIS — J45990 Exercise induced bronchospasm: Secondary | ICD-10-CM | POA: Diagnosis not present

## 2022-08-15 DIAGNOSIS — Z79899 Other long term (current) drug therapy: Secondary | ICD-10-CM | POA: Diagnosis not present

## 2022-08-15 DIAGNOSIS — E781 Pure hyperglyceridemia: Secondary | ICD-10-CM | POA: Diagnosis not present

## 2022-08-15 DIAGNOSIS — Z0000 Encounter for general adult medical examination without abnormal findings: Secondary | ICD-10-CM | POA: Diagnosis not present

## 2022-09-06 DIAGNOSIS — D72829 Elevated white blood cell count, unspecified: Secondary | ICD-10-CM | POA: Diagnosis not present

## 2022-11-21 DIAGNOSIS — N3001 Acute cystitis with hematuria: Secondary | ICD-10-CM | POA: Diagnosis not present

## 2022-11-21 DIAGNOSIS — R300 Dysuria: Secondary | ICD-10-CM | POA: Diagnosis not present

## 2022-11-21 DIAGNOSIS — R350 Frequency of micturition: Secondary | ICD-10-CM | POA: Diagnosis not present

## 2022-11-22 DIAGNOSIS — Z713 Dietary counseling and surveillance: Secondary | ICD-10-CM | POA: Diagnosis not present

## 2022-11-22 DIAGNOSIS — E669 Obesity, unspecified: Secondary | ICD-10-CM | POA: Diagnosis not present

## 2022-12-01 DIAGNOSIS — L821 Other seborrheic keratosis: Secondary | ICD-10-CM | POA: Diagnosis not present

## 2022-12-01 DIAGNOSIS — D224 Melanocytic nevi of scalp and neck: Secondary | ICD-10-CM | POA: Diagnosis not present

## 2022-12-01 DIAGNOSIS — D2271 Melanocytic nevi of right lower limb, including hip: Secondary | ICD-10-CM | POA: Diagnosis not present

## 2022-12-01 DIAGNOSIS — D2261 Melanocytic nevi of right upper limb, including shoulder: Secondary | ICD-10-CM | POA: Diagnosis not present

## 2023-01-30 DIAGNOSIS — Z01419 Encounter for gynecological examination (general) (routine) without abnormal findings: Secondary | ICD-10-CM | POA: Diagnosis not present

## 2023-01-30 DIAGNOSIS — Z1231 Encounter for screening mammogram for malignant neoplasm of breast: Secondary | ICD-10-CM | POA: Diagnosis not present

## 2023-01-30 DIAGNOSIS — R1032 Left lower quadrant pain: Secondary | ICD-10-CM | POA: Diagnosis not present

## 2023-01-30 DIAGNOSIS — Z124 Encounter for screening for malignant neoplasm of cervix: Secondary | ICD-10-CM | POA: Diagnosis not present

## 2023-02-13 DIAGNOSIS — D259 Leiomyoma of uterus, unspecified: Secondary | ICD-10-CM | POA: Diagnosis not present

## 2023-02-13 DIAGNOSIS — R1032 Left lower quadrant pain: Secondary | ICD-10-CM | POA: Diagnosis not present

## 2023-02-13 DIAGNOSIS — R1900 Intra-abdominal and pelvic swelling, mass and lump, unspecified site: Secondary | ICD-10-CM | POA: Diagnosis not present

## 2023-05-20 DIAGNOSIS — E669 Obesity, unspecified: Secondary | ICD-10-CM | POA: Diagnosis not present

## 2023-05-28 DIAGNOSIS — E669 Obesity, unspecified: Secondary | ICD-10-CM | POA: Diagnosis not present

## 2023-06-11 DIAGNOSIS — Z713 Dietary counseling and surveillance: Secondary | ICD-10-CM | POA: Diagnosis not present

## 2023-06-11 DIAGNOSIS — E669 Obesity, unspecified: Secondary | ICD-10-CM | POA: Diagnosis not present

## 2023-06-12 DIAGNOSIS — Z713 Dietary counseling and surveillance: Secondary | ICD-10-CM | POA: Diagnosis not present

## 2023-06-12 DIAGNOSIS — E669 Obesity, unspecified: Secondary | ICD-10-CM | POA: Diagnosis not present

## 2023-07-11 DIAGNOSIS — Z713 Dietary counseling and surveillance: Secondary | ICD-10-CM | POA: Diagnosis not present

## 2023-07-11 DIAGNOSIS — E669 Obesity, unspecified: Secondary | ICD-10-CM | POA: Diagnosis not present

## 2023-08-09 DIAGNOSIS — Z713 Dietary counseling and surveillance: Secondary | ICD-10-CM | POA: Diagnosis not present

## 2023-08-09 DIAGNOSIS — E669 Obesity, unspecified: Secondary | ICD-10-CM | POA: Diagnosis not present

## 2023-08-24 DIAGNOSIS — Z79899 Other long term (current) drug therapy: Secondary | ICD-10-CM | POA: Diagnosis not present

## 2023-08-24 DIAGNOSIS — E781 Pure hyperglyceridemia: Secondary | ICD-10-CM | POA: Diagnosis not present

## 2023-08-24 DIAGNOSIS — Z23 Encounter for immunization: Secondary | ICD-10-CM | POA: Diagnosis not present

## 2023-08-24 DIAGNOSIS — Z0000 Encounter for general adult medical examination without abnormal findings: Secondary | ICD-10-CM | POA: Diagnosis not present

## 2023-08-24 DIAGNOSIS — E559 Vitamin D deficiency, unspecified: Secondary | ICD-10-CM | POA: Diagnosis not present

## 2023-09-07 DIAGNOSIS — E669 Obesity, unspecified: Secondary | ICD-10-CM | POA: Diagnosis not present

## 2023-09-07 DIAGNOSIS — Z713 Dietary counseling and surveillance: Secondary | ICD-10-CM | POA: Diagnosis not present

## 2023-10-03 DIAGNOSIS — Z713 Dietary counseling and surveillance: Secondary | ICD-10-CM | POA: Diagnosis not present

## 2023-10-03 DIAGNOSIS — E669 Obesity, unspecified: Secondary | ICD-10-CM | POA: Diagnosis not present

## 2023-10-25 DIAGNOSIS — E669 Obesity, unspecified: Secondary | ICD-10-CM | POA: Diagnosis not present

## 2023-10-25 DIAGNOSIS — Z713 Dietary counseling and surveillance: Secondary | ICD-10-CM | POA: Diagnosis not present

## 2023-10-25 DIAGNOSIS — K219 Gastro-esophageal reflux disease without esophagitis: Secondary | ICD-10-CM | POA: Diagnosis not present

## 2023-10-25 DIAGNOSIS — Z7182 Exercise counseling: Secondary | ICD-10-CM | POA: Diagnosis not present

## 2023-11-25 DIAGNOSIS — Z6829 Body mass index (BMI) 29.0-29.9, adult: Secondary | ICD-10-CM | POA: Diagnosis not present

## 2023-11-25 DIAGNOSIS — E669 Obesity, unspecified: Secondary | ICD-10-CM | POA: Diagnosis not present

## 2023-12-05 DIAGNOSIS — D225 Melanocytic nevi of trunk: Secondary | ICD-10-CM | POA: Diagnosis not present

## 2023-12-05 DIAGNOSIS — D2261 Melanocytic nevi of right upper limb, including shoulder: Secondary | ICD-10-CM | POA: Diagnosis not present

## 2023-12-05 DIAGNOSIS — D485 Neoplasm of uncertain behavior of skin: Secondary | ICD-10-CM | POA: Diagnosis not present

## 2023-12-05 DIAGNOSIS — D224 Melanocytic nevi of scalp and neck: Secondary | ICD-10-CM | POA: Diagnosis not present

## 2023-12-05 DIAGNOSIS — D2262 Melanocytic nevi of left upper limb, including shoulder: Secondary | ICD-10-CM | POA: Diagnosis not present

## 2023-12-05 DIAGNOSIS — L718 Other rosacea: Secondary | ICD-10-CM | POA: Diagnosis not present

## 2023-12-23 DIAGNOSIS — Z6828 Body mass index (BMI) 28.0-28.9, adult: Secondary | ICD-10-CM | POA: Diagnosis not present

## 2023-12-23 DIAGNOSIS — E669 Obesity, unspecified: Secondary | ICD-10-CM | POA: Diagnosis not present

## 2024-01-05 ENCOUNTER — Other Ambulatory Visit: Payer: Self-pay | Admitting: Obstetrics and Gynecology

## 2024-01-05 DIAGNOSIS — Z1231 Encounter for screening mammogram for malignant neoplasm of breast: Secondary | ICD-10-CM

## 2024-01-24 DIAGNOSIS — E669 Obesity, unspecified: Secondary | ICD-10-CM | POA: Diagnosis not present

## 2024-01-24 DIAGNOSIS — Z713 Dietary counseling and surveillance: Secondary | ICD-10-CM | POA: Diagnosis not present

## 2024-01-24 DIAGNOSIS — Z6826 Body mass index (BMI) 26.0-26.9, adult: Secondary | ICD-10-CM | POA: Diagnosis not present

## 2024-02-06 DIAGNOSIS — Z01419 Encounter for gynecological examination (general) (routine) without abnormal findings: Secondary | ICD-10-CM | POA: Diagnosis not present

## 2024-02-06 DIAGNOSIS — Z124 Encounter for screening for malignant neoplasm of cervix: Secondary | ICD-10-CM | POA: Diagnosis not present

## 2024-02-06 DIAGNOSIS — Z1330 Encounter for screening examination for mental health and behavioral disorders, unspecified: Secondary | ICD-10-CM | POA: Diagnosis not present

## 2024-02-08 ENCOUNTER — Ambulatory Visit
Admission: RE | Admit: 2024-02-08 | Discharge: 2024-02-08 | Disposition: A | Discharge status: Home / Self Care | Payer: BC Managed Care – PPO | Source: Ambulatory Visit | Attending: Obstetrics and Gynecology | Admitting: Obstetrics and Gynecology

## 2024-02-08 DIAGNOSIS — Z1231 Encounter for screening mammogram for malignant neoplasm of breast: Secondary | ICD-10-CM | POA: Diagnosis not present

## 2024-02-17 DIAGNOSIS — Z6826 Body mass index (BMI) 26.0-26.9, adult: Secondary | ICD-10-CM | POA: Diagnosis not present

## 2024-02-17 DIAGNOSIS — Z713 Dietary counseling and surveillance: Secondary | ICD-10-CM | POA: Diagnosis not present

## 2024-02-17 DIAGNOSIS — E669 Obesity, unspecified: Secondary | ICD-10-CM | POA: Diagnosis not present

## 2024-03-28 DIAGNOSIS — F32A Depression, unspecified: Secondary | ICD-10-CM | POA: Diagnosis not present

## 2024-03-28 DIAGNOSIS — F419 Anxiety disorder, unspecified: Secondary | ICD-10-CM | POA: Diagnosis not present

## 2024-03-28 DIAGNOSIS — Z1211 Encounter for screening for malignant neoplasm of colon: Secondary | ICD-10-CM | POA: Diagnosis not present

## 2024-05-06 DIAGNOSIS — K5730 Diverticulosis of large intestine without perforation or abscess without bleeding: Secondary | ICD-10-CM | POA: Diagnosis not present

## 2024-05-06 DIAGNOSIS — Z83719 Family history of colon polyps, unspecified: Secondary | ICD-10-CM | POA: Diagnosis not present

## 2024-05-06 DIAGNOSIS — Z1211 Encounter for screening for malignant neoplasm of colon: Secondary | ICD-10-CM | POA: Diagnosis not present

## 2024-05-06 DIAGNOSIS — K648 Other hemorrhoids: Secondary | ICD-10-CM | POA: Diagnosis not present

## 2024-08-28 DIAGNOSIS — F411 Generalized anxiety disorder: Secondary | ICD-10-CM | POA: Diagnosis not present

## 2024-08-28 DIAGNOSIS — Z79899 Other long term (current) drug therapy: Secondary | ICD-10-CM | POA: Diagnosis not present

## 2024-08-28 DIAGNOSIS — J45990 Exercise induced bronchospasm: Secondary | ICD-10-CM | POA: Diagnosis not present

## 2024-08-28 DIAGNOSIS — Z0000 Encounter for general adult medical examination without abnormal findings: Secondary | ICD-10-CM | POA: Diagnosis not present

## 2024-08-28 DIAGNOSIS — E781 Pure hyperglyceridemia: Secondary | ICD-10-CM | POA: Diagnosis not present

## 2024-09-10 DIAGNOSIS — E669 Obesity, unspecified: Secondary | ICD-10-CM | POA: Diagnosis not present

## 2024-09-10 DIAGNOSIS — F419 Anxiety disorder, unspecified: Secondary | ICD-10-CM | POA: Diagnosis not present

## 2024-10-16 DIAGNOSIS — L659 Nonscarring hair loss, unspecified: Secondary | ICD-10-CM | POA: Diagnosis not present
